# Patient Record
Sex: Female | Born: 1940 | Race: White | Hispanic: No | Smoking: Former smoker
Health system: Southern US, Community
[De-identification: ages and names within clinical notes are randomized; demographics above are authoritative.]

## PROBLEM LIST (undated history)

## (undated) DIAGNOSIS — G250 Essential tremor: Secondary | ICD-10-CM

## (undated) DIAGNOSIS — K219 Gastro-esophageal reflux disease without esophagitis: Secondary | ICD-10-CM

## (undated) DIAGNOSIS — M1990 Unspecified osteoarthritis, unspecified site: Secondary | ICD-10-CM

## (undated) DIAGNOSIS — F329 Major depressive disorder, single episode, unspecified: Secondary | ICD-10-CM

## (undated) DIAGNOSIS — F419 Anxiety disorder, unspecified: Secondary | ICD-10-CM

## (undated) DIAGNOSIS — M7552 Bursitis of left shoulder: Secondary | ICD-10-CM

## (undated) DIAGNOSIS — I10 Essential (primary) hypertension: Secondary | ICD-10-CM

## (undated) DIAGNOSIS — E7800 Pure hypercholesterolemia, unspecified: Secondary | ICD-10-CM

## (undated) DIAGNOSIS — M545 Low back pain, unspecified: Secondary | ICD-10-CM

## (undated) DIAGNOSIS — F32A Depression, unspecified: Secondary | ICD-10-CM

## (undated) DIAGNOSIS — E039 Hypothyroidism, unspecified: Secondary | ICD-10-CM

## (undated) DIAGNOSIS — I519 Heart disease, unspecified: Secondary | ICD-10-CM

## (undated) HISTORY — DX: Essential tremor: G25.0

## (undated) HISTORY — DX: Essential (primary) hypertension: I10

## (undated) HISTORY — DX: Low back pain, unspecified: M54.50

## (undated) HISTORY — PX: TONSILLECTOMY: SUR1361

## (undated) HISTORY — DX: Depression, unspecified: F32.A

## (undated) HISTORY — PX: TUBAL LIGATION: SHX77

## (undated) HISTORY — DX: Gastro-esophageal reflux disease without esophagitis: K21.9

## (undated) HISTORY — DX: Low back pain: M54.5

## (undated) HISTORY — DX: Bursitis of left shoulder: M75.52

## (undated) HISTORY — PX: APPENDECTOMY: SHX54

## (undated) HISTORY — DX: Heart disease, unspecified: I51.9

## (undated) HISTORY — DX: Major depressive disorder, single episode, unspecified: F32.9

## (undated) HISTORY — DX: Pure hypercholesterolemia, unspecified: E78.00

## (undated) HISTORY — DX: Unspecified osteoarthritis, unspecified site: M19.90

## (undated) HISTORY — DX: Anxiety disorder, unspecified: F41.9

---

## 2017-08-10 ENCOUNTER — Ambulatory Visit: Payer: Self-pay | Admitting: Neurology

## 2017-09-07 ENCOUNTER — Ambulatory Visit: Payer: Self-pay | Admitting: Neurology

## 2017-09-08 ENCOUNTER — Ambulatory Visit: Payer: Self-pay | Admitting: Neurology

## 2017-09-28 ENCOUNTER — Encounter: Payer: Self-pay | Admitting: Neurology

## 2017-09-28 ENCOUNTER — Telehealth: Payer: Self-pay | Admitting: Neurology

## 2017-09-28 ENCOUNTER — Ambulatory Visit: Payer: Medicare HMO | Admitting: Neurology

## 2017-09-28 VITALS — BP 129/73 | HR 86 | Ht 63.0 in | Wt 162.0 lb

## 2017-09-28 DIAGNOSIS — R251 Tremor, unspecified: Secondary | ICD-10-CM

## 2017-09-28 NOTE — Telephone Encounter (Signed)
Mcarthur Rossetti Josem Kaufmann: [phone] (exp. 09/28/17 to 10/28/17) order faxed to [facility] they will reach out to the pt to schedule

## 2017-09-28 NOTE — Patient Instructions (Addendum)
Your exam neurological exam looks good, no telltale tremor today. No signs of parkinsonism or Parkinson's disease, which is reassuring.   Please remember, that any kind of tremor may be exacerbated by anxiety, anger, nervousness, excitement, dehydration, sleep deprivation, by caffeine, and low blood sugar values or blood sugar fluctuations. Some medications, especially some antidepressants and lithium can cause or exacerbate tremors.  We will do a brain scan, called MRI and call you with the test results. We will have to schedule you for this on a separate date. This test requires authorization from your insurance, and we will take care of the insurance process.  So long as your brain MRI is age-appropriate, I could see you back on an as-needed basis.

## 2017-09-28 NOTE — Progress Notes (Signed)
Subjective:    Patient ID: Katie Wagner is a 77 y.o. female.  HPI     Star Age, MD, PhD [facility] [address]O. [place], [place] [address]  Dear Jeannetta Nap,   I saw your patient, Katie Wagner, upon your kind request in my neurologic clinic today for initial consultation of her tremor. The patient is unaccompanied today. As you know, Katie Wagner is a 77 year old right-handed woman with an underlying medical history of allergies, hypertension, hyperlipidemia, reflux disease, depression and mildly overweight state, who reports an approximately one-year history of intermittent tremor, affecting primarily her left upper extremity. She noticed it primarily in the later part of the day. It does not always bother her. It is not necessarily a daily occurrence. She has not noticed any telltale trigger. She does not report a family history of tremors with the exception of one sister who had a hand tremor until she was treated for her thyroid problem. Patient reports a recent abnormal thyroid function test for which she has a follow-up appointment. I reviewed your office note from 06/27/2017, which you kindly included. She reports a family history of Parkinson's disease in her paternal grandmother and 2 paternal cousins were not siblings. She is the oldest of the girls, she has a total of 2 brothers and 4 sisters, she is the third sibling. She lost one sister last year. She is single/divorced and lives alone. She is retired Education officer, environmental), has 3 grown children. She quit smoking in 1980, drinks alcohol very infrequently, drinks caffeine in the form of coffee, 1-2 cups in the mornings, 1 diet cola per day and 1-2 cups of iced tea. She admits that she does not always drink enough water. She has not noticed any balance problems with fine motor difficulties, no falls, no memory loss. She has a long-standing history of anxiety and is well treated on generic Effexor. She has  been on it for about 16 years or so.  Her Past Medical History Is Significant For: Past Medical History:  Diagnosis Date  . Anxiety   . Arthritis   . Bursitis of left shoulder   . Depression   . Essential tremor   . GERD (gastroesophageal reflux disease)   . Heart disease   . Hypercholesteremia   . Hypertension   . Low back pain     Her Past Surgical History Is Significant For:  Her Family History Is Significant For: Family History  Problem Relation Age of Onset  . Stroke Mother   . Dementia Mother   . Lung cancer Father   . Heart disease Father   . Thyroid disease Sister   . Diabetes Brother   . Alzheimer's disease Brother     Her Social History Is Significant For: Social History   Socioeconomic History  . Marital status: Divorced    Spouse name: Not on file  . Number of children: Not on file  . Years of education: Not on file  . Highest education level: Not on file  Occupational History  . Not on file  Social Needs  . Financial resource strain: Not on file  . Food insecurity:    Worry: Not on file    Inability: Not on file  . Transportation needs:    Medical: Not on file    Non-medical: Not on file  Tobacco Use  . Smoking status: Former Research scientist (life sciences)  . Smokeless tobacco: Never Used  Substance and Sexual Activity  . Alcohol use: Yes  .  Drug use: Never  . Sexual activity: Not on file  Lifestyle  . Physical activity:    Days per week: Not on file    Minutes per session: Not on file  . Stress: Not on file  Relationships  . Social connections:    Talks on phone: Not on file    Gets together: Not on file    Attends religious service: Not on file    Active member of club or organization: Not on file    Attends meetings of clubs or organizations: Not on file    Relationship status: Not on file  Other Topics Concern  . Not on file  Social History Narrative  . Not on file    Her Allergies Are:  No Known Allergies:   Her Current Medications Are:   Outpatient Encounter Medications as of 09/28/2017  Medication Sig  . acetaminophen (TYLENOL) 500 MG tablet Take 500 mg by mouth.  . cetirizine (ZYRTEC) 10 MG tablet Take 10 mg by mouth daily.  . fluticasone (FLONASE) 50 MCG/ACT nasal spray Place into both nostrils daily.  Marland Kitchen lisinopril-hydrochlorothiazide (PRINZIDE,ZESTORETIC) 20-12.5 MG tablet Take 1 tablet by mouth daily.  Marland Kitchen omeprazole (PRILOSEC) 20 MG capsule Take 20 mg by mouth daily.  . pravastatin (PRAVACHOL) 20 MG tablet Take 20 mg by mouth daily.  Marland Kitchen venlafaxine XR (EFFEXOR-XR) 150 MG 24 hr capsule Take 150 mg by mouth daily with breakfast.  . [DISCONTINUED] calcium-vitamin D (OSCAL WITH D) 500-200 MG-UNIT tablet Take 1 tablet by mouth.   No facility-administered encounter medications on file as of 09/28/2017.   : Review of Systems:  Out of a complete 14 point review of systems, all are reviewed and negative with the exception of these symptoms as listed below:  Review of Systems  Neurological:       Pt presents today to discuss her tremor. Pt notices the tremors in her left hand and sometimes in her face. Pt has noticed these tremors for about one year. Pt is right handed.    Objective:  Neurological Exam  Physical Exam Physical Examination:   Vitals:   09/28/17 1431  BP: 129/73  Pulse: 86    General Examination: The patient is a very pleasant 77 y.o. female in no acute distress. She appears well-developed and well-nourished and well groomed.   HEENT: Normocephalic, atraumatic, pupils are equal, round and reactive to light and accommodation. Extraocular tracking is good without limitation to gaze excursion or nystagmus noted. Normal smooth pursuit is noted. Hearing is grossly intact. Face is symmetric with normal facial animation and normal facial sensation. Speech is clear with no dysarthria noted. There is no hypophonia. There is no lip, neck/head, jaw or voice tremor. Neck is supple with full range of passive and active  motion. There are no carotid bruits on auscultation. Oropharynx exam reveals; nonfocal findings, she has no orofacial dyskinesias.   Chest: Clear to auscultation without wheezing, rhonchi or crackles noted.  Heart: S1+S2+0, regular and normal without murmurs, rubs or gallops noted.   Abdomen: Soft, non-tender and non-distended with normal bowel sounds appreciated on auscultation.  Extremities: There is no pitting edema in the distal lower extremities bilaterally.  Skin: Warm and dry without trophic changes noted.  Musculoskeletal: exam reveals no obvious joint deformities, tenderness or joint swelling or erythema. Left shoulder slightly higher than right.  Neurologically:  Mental status: The patient is awake, alert and oriented in all 4 spheres. Her immediate and remote memory, attention, language skills and fund of  knowledge are appropriate. There is no evidence of aphasia, agnosia, apraxia or anomia. Speech is clear with normal prosody and enunciation. Thought process is linear. Mood is normal and affect is normal.  Cranial nerves II - XII are as described above under HEENT exam.   Motor exam: Normal bulk, strength and tone is noted. There is no drift, or rebound. No resting tremor. She has no apneas posture or action tremor with both upper extremities, no lower extremity tremor.   On 09/28/2017: On Archimedes spiral drawing she has no difficulty with the right which is her dominant hand, minimal tremulousness noted with the left. Handwriting with the right is legible, minimally tremulous, not particularly micrographic.  Romberg is negative. Reflexes are 2+ throughout. Fine motor skills and coordination: intact with normal finger taps, normal hand movements, normal rapid alternating patting, normal foot taps and normal foot agility.  Cerebellar testing: No dysmetria or intention tremor on finger to nose testing. Heel to shin is unremarkable bilaterally. There is no truncal or gait ataxia.   Sensory exam: intact to light touch in the upper and lower extremities.  Gait, station and balance: She stands easily. No veering to one side is noted. No leaning to one side is noted. Posture is age-appropriate and stance is narrow based. Gait shows normal stride length and normal pace. No problems turning are noted. Questionable decreased arm swing on the left.  Assessment and Plan:    In summary, Katie Wagner is a very pleasant 77 y.o.-year old female with an underlying medical history of allergies, hypertension, hyperlipidemia, reflux disease, depression and mildly overweight state, who presents for evaluation of her intermittent tremors affecting her left hand. On examination she has a benign exam, no telltale signs of parkinsonism, no telltale signs of essential tremor. She is reassured in that regard. I would like to proceed with a brain MRI for diagnostic completion, to rule out a structural cause or stroke for her symptoms. We talked about tremor triggers including anxiety, certain antidepressant medications, caffeine, sleep deprivation, nervousness, dehydration, blood sugar fluctuations, thyroid dysfunction etc. at length today. She is advised to follow-up with you for her thyroid function. Furthermore, she is encouraged to stay better hydrated with water, try to decrease her caffeine intake. Her anxiety is under good control with the current dose of Effexor generic, no reason to change the dose of the medication from my end of things. I suggested watchful waiting and observation clinically. She can follow-up with me as needed, in the interim, we will call her with her MRI results when they are available. I answered all her questions today and she was in agreement.  Thank you very much for allowing me to participate in the care of this nice patient. If I can be of any further assistance to you please do not hesitate to call me at [phone].  Sincerely,   Star Age, MD, PhD

## 2017-09-29 NOTE — Telephone Encounter (Signed)
Patient is scheduled at [facility] for 10/26/17.

## 2017-10-31 ENCOUNTER — Telehealth: Payer: Self-pay | Admitting: Neurology

## 2017-10-31 DIAGNOSIS — D329 Benign neoplasm of meninges, unspecified: Secondary | ICD-10-CM

## 2017-10-31 NOTE — Telephone Encounter (Signed)
Please call patient regarding the recent brain MRI: The brain scan showed a normal structure of the brain and mild volume loss which we call atrophy. There were changes in the deeper structures of the brain, which we call white matter changes or microvascular changes. These were reported as mild in Her case. These are tiny white spots, that occur with time and are seen in a variety of conditions, including with normal aging, chronic hypertension, chronic headaches, especially migraine HAs, chronic diabetes, chronic hyperlipidemia.  While there were no acute findings and no explanation for her tremor, there was one abnormality noted which was a couple of areas where the contrast was picked up more intensely, this was reported as an incidental finding and in keeping with benign tumors called meningiomas (which don't affect the brain and arise from the outer lining of the brain). Again, this is a incidental finding and does not explain her symptoms. We can consider follow-up MRI for this in about 3 to 6 months. No treatment is typically necessary, but sometimes we seek a surgical consultation.   Again, there were no acute findings, such as a stroke, or mass or blood products. No further action is required on this test at this time, other than re-enforcing the importance of good blood pressure control, good cholesterol control, good blood sugar control, and weight management.   I would be happy to order another MRI in about 3 months for a recheck and see her back as needed.  She can call in 3 months to request the FU MRI.

## 2017-10-31 NOTE — Telephone Encounter (Signed)
Patient calling to get MRI results.

## 2017-11-01 NOTE — Telephone Encounter (Signed)
I called pt to discuss her MRI results. No answer, left a message asking her to call me back. 

## 2017-11-01 NOTE — Telephone Encounter (Signed)
I called pt and discussed her MRI results with her. Pt is agreeable to rechecking her meningiomas in 3 months and will call us to reorder the MRI. I reiterated the importance of good BP, cholesterol, BS control, and weight management. Pt verbalized understanding of results. Pt asked that I send a copy of these results to [place], [place]. Pt had no questions at this time but was encouraged to call back if questions arise.

## 2018-01-30 NOTE — Addendum Note (Signed)
Addended by: Star Age on: 01/30/2018 05:00 PM   Modules accepted: Orders

## 2018-01-30 NOTE — Telephone Encounter (Signed)
Patient called stating she needs to redo her MRI Brain please advise.

## 2018-01-30 NOTE — Telephone Encounter (Signed)
Ordered MRI brain w/wo contrast to re-eval meningiomas.

## 2018-01-31 ENCOUNTER — Telehealth: Payer: Self-pay | Admitting: Neurology

## 2018-01-31 NOTE — Telephone Encounter (Signed)
Patient is aware 

## 2018-01-31 NOTE — Telephone Encounter (Signed)
Mcarthur Rossetti Josem Kaufmann: [phone] (exp. 01/31/18 to 03/02/18) order faxed to [facility] for open MRI they will reach out to the pt to schedule

## 2018-02-08 ENCOUNTER — Telehealth: Payer: Self-pay

## 2018-02-08 NOTE — Telephone Encounter (Signed)
MRI brain shows stable findings and no FU is recommended as far as the reading radiologist's report. No further action is required.  I can see her back for tremor as needed at this point.  Please update pt.

## 2018-02-08 NOTE — Telephone Encounter (Signed)
Received MRI results from [facility]. 

## 2018-02-08 NOTE — Telephone Encounter (Addendum)
I called Katie Wagner and discussed her MRI results. Katie Wagner will follow up with Dr. Rexene Alberts PRN for her tremor. Katie Wagner verbalized understanding of results. Katie Wagner had no questions at this time but was encouraged to call back if questions arise.

## 2018-02-17 ENCOUNTER — Telehealth: Payer: Self-pay

## 2018-02-17 NOTE — Telephone Encounter (Signed)
SENT REFERRAL TO SCHEDULING AND FILED NOTES 

## 2018-04-03 ENCOUNTER — Ambulatory Visit: Payer: Medicare HMO | Admitting: Cardiovascular Disease

## 2018-04-03 ENCOUNTER — Encounter: Payer: Self-pay | Admitting: *Deleted

## 2018-04-03 ENCOUNTER — Encounter: Payer: Self-pay | Admitting: Cardiovascular Disease

## 2018-04-03 VITALS — BP 132/70 | HR 97 | Ht 63.0 in | Wt 165.0 lb

## 2018-04-03 DIAGNOSIS — I447 Left bundle-branch block, unspecified: Secondary | ICD-10-CM

## 2018-04-03 DIAGNOSIS — I10 Essential (primary) hypertension: Secondary | ICD-10-CM

## 2018-04-03 DIAGNOSIS — R0609 Other forms of dyspnea: Secondary | ICD-10-CM | POA: Diagnosis not present

## 2018-04-03 NOTE — Progress Notes (Signed)
CARDIOLOGY CONSULT NOTE  Patient ID: Katie Wagner MRN: [id number] DOB/AGE: 1941-02-25 78 y.o.  Admit date: (Not on file) Primary Physician: Denny Levy, [place] Referring Physician: Denny Levy, [place]  Reason for Consultation: Chest pain  HPI: Katie Wagner is a 78 y.o. female who is being seen today for the evaluation of chest pain at the request of Denny Levy, [place].   Past medical history includes hypertension and hyperlipidemia.  I reviewed notes from her PCP.  I also personally reviewed an ECG performed on 02/14/2018 which demonstrated sinus rhythm and left bundle branch block.  I reviewed labs: TSH was normal at 1.69 on 09/23/2017.  A1c was 6%.  She has a long history of left bundle branch block.  She brought in a nuclear stress test report which I reviewed dated 06/25/1999 which showed no evidence of ischemia but did demonstrate hypertensive heart disease.  She had been having chest tightness but thinks it was due to an upper respiratory tract infection.  The symptoms have since resolved.  She does have some exertional dyspnea when doing routine household activities.  She denies palpitations, leg swelling, orthopnea, syncope, and paroxysmal nocturnal dyspnea.  Her sister, Alvina Filbert, used to be my patient.  She passed away in May 31, 2016.  No Known Allergies  Current Outpatient Medications  Medication Sig Dispense Refill  . acetaminophen (TYLENOL) 500 MG tablet Take 500 mg by mouth.    . cetirizine (ZYRTEC) 10 MG tablet Take 10 mg by mouth daily.    . fluticasone (FLONASE) 50 MCG/ACT nasal spray Place into both nostrils daily.    Marland Kitchen lisinopril-hydrochlorothiazide (PRINZIDE,ZESTORETIC) 20-12.5 MG tablet Take 1 tablet by mouth daily.    Marland Kitchen omeprazole (PRILOSEC) 20 MG capsule Take 20 mg by mouth daily.    . pravastatin (PRAVACHOL) 20 MG tablet Take 20 mg by mouth daily.    Marland Kitchen venlafaxine XR (EFFEXOR-XR) 150 MG 24 hr capsule Take 150 mg by mouth daily with  breakfast.     No current facility-administered medications for this visit.     Past Medical History:  Diagnosis Date  . Anxiety   . Arthritis   . Bursitis of left shoulder   . Depression   . Essential tremor   . GERD (gastroesophageal reflux disease)   . Heart disease   . Hypercholesteremia   . Hypertension   . Low back pain     Past Surgical History:  Procedure Laterality Date  . APPENDECTOMY    . TONSILLECTOMY    . TUBAL LIGATION      Social History   Socioeconomic History  . Marital status: Divorced    Spouse name: Not on file  . Number of children: Not on file  . Years of education: Not on file  . Highest education level: Not on file  Occupational History  . Not on file  Social Needs  . Financial resource strain: Not on file  . Food insecurity:    Worry: Not on file    Inability: Not on file  . Transportation needs:    Medical: Not on file    Non-medical: Not on file  Tobacco Use  . Smoking status: Former Research scientist (life sciences)  . Smokeless tobacco: Never Used  Substance and Sexual Activity  . Alcohol use: Yes  . Drug use: Never  . Sexual activity: Not on file  Lifestyle  . Physical activity:    Days per week: Not on file    Minutes per session: Not on  file  . Stress: Not on file  Relationships  . Social connections:    Talks on phone: Not on file    Gets together: Not on file    Attends religious service: Not on file    Active member of club or organization: Not on file    Attends meetings of clubs or organizations: Not on file    Relationship status: Not on file  . Intimate partner violence:    Fear of current or ex partner: Not on file    Emotionally abused: Not on file    Physically abused: Not on file    Forced sexual activity: Not on file  Other Topics Concern  . Not on file  Social History Narrative  . Not on file     No family history of premature CAD in 1st degree relatives.  Current Meds  Medication Sig  . acetaminophen (TYLENOL) 500 MG  tablet Take 500 mg by mouth.  . cetirizine (ZYRTEC) 10 MG tablet Take 10 mg by mouth daily.  . fluticasone (FLONASE) 50 MCG/ACT nasal spray Place into both nostrils daily.  Marland Kitchen lisinopril-hydrochlorothiazide (PRINZIDE,ZESTORETIC) 20-12.5 MG tablet Take 1 tablet by mouth daily.  Marland Kitchen omeprazole (PRILOSEC) 20 MG capsule Take 20 mg by mouth daily.  . pravastatin (PRAVACHOL) 20 MG tablet Take 20 mg by mouth daily.  Marland Kitchen venlafaxine XR (EFFEXOR-XR) 150 MG 24 hr capsule Take 150 mg by mouth daily with breakfast.      Review of systems complete and found to be negative unless listed above in HPI    Physical exam Blood pressure 132/70, pulse 97, height 5\' 3"  (1.6 m), weight 165 lb (74.8 kg), SpO2 94 %. General: NAD Neck: No JVD, no thyromegaly or thyroid nodule.  Lungs: Clear to auscultation bilaterally with normal respiratory effort. CV: Nondisplaced PMI. Regular rate and rhythm, normal S1/S2, no S3/S4, no murmur.  No peripheral edema.  No carotid bruit.    Abdomen: Soft, nontender, no distention.  Skin: Intact without lesions or rashes.  Neurologic: Alert and oriented x 3.  Psych: Normal affect. Extremities: No clubbing or cyanosis.  HEENT: Normal.   ECG: Most recent ECG reviewed.   Labs: No results found for: K, BUN, CREATININE, ALT, TSH, HGB   Lipids: No results found for: LDLCALC, LDLDIRECT, CHOL, TRIG, HDL      ASSESSMENT AND PLAN:  1.  Shortness of breath/left bundle branch block: She underwent a nuclear stress test in 2001 which showed no evidence of ischemia. I will proceed with a nuclear myocardial perfusion imaging study to evaluate for ischemic heart disease (Lexiscan Myoview). I will order a 2-D echocardiogram with Doppler to evaluate cardiac structure, function, and regional wall motion.  2.  Hypertension: Blood pressure is normal.  No changes to therapy.    Disposition: Follow up in 4 months  Signed: Kate Sable, M.D., F.A.C.C.  04/03/2018, 2:36 PM

## 2018-04-03 NOTE — Patient Instructions (Signed)
Medication Instructions:  Continue all current medications.  Labwork: none  Testing/Procedures:  Your physician has requested that you have an echocardiogram. Echocardiography is a painless test that uses sound waves to create images of your heart. It provides your doctor with information about the size and shape of your heart and how well your heart's chambers and valves are working. This procedure takes approximately one hour. There are no restrictions for this procedure.  Your physician has requested that you have a lexiscan myoview. For further information please visit [email or web address]. Please follow instruction sheet, as given.  Office will contact with results via phone or letter.    Follow-Up: 4 months   Any Other Special Instructions Will Be Listed Below (If Applicable).  If you need a refill on your cardiac medications before your next appointment, please call your pharmacy.

## 2018-04-04 ENCOUNTER — Telehealth: Payer: Self-pay | Admitting: Cardiovascular Disease

## 2018-04-04 NOTE — Telephone Encounter (Signed)
Pre-cert Verification for the following procedure    Echo scheduled 04-13-2018 at [facility] scheduled for 04-14-2018 at [facility]

## 2018-04-13 ENCOUNTER — Ambulatory Visit (INDEPENDENT_AMBULATORY_CARE_PROVIDER_SITE_OTHER): Payer: Medicare HMO

## 2018-04-13 DIAGNOSIS — I447 Left bundle-branch block, unspecified: Secondary | ICD-10-CM | POA: Diagnosis not present

## 2018-04-13 DIAGNOSIS — R0609 Other forms of dyspnea: Secondary | ICD-10-CM | POA: Diagnosis not present

## 2018-04-14 ENCOUNTER — Encounter (HOSPITAL_BASED_OUTPATIENT_CLINIC_OR_DEPARTMENT_OTHER)
Admission: RE | Admit: 2018-04-14 | Discharge: 2018-04-14 | Disposition: A | Discharge status: Home / Self Care | Payer: Medicare HMO | Source: Ambulatory Visit | Attending: Cardiovascular Disease | Admitting: Cardiovascular Disease

## 2018-04-14 ENCOUNTER — Encounter (HOSPITAL_COMMUNITY)
Admission: RE | Admit: 2018-04-14 | Discharge: 2018-04-14 | Disposition: A | Discharge status: Home / Self Care | Payer: Medicare HMO | Source: Ambulatory Visit | Attending: Cardiovascular Disease | Admitting: Cardiovascular Disease

## 2018-04-14 ENCOUNTER — Telehealth: Payer: Self-pay | Admitting: *Deleted

## 2018-04-14 DIAGNOSIS — I447 Left bundle-branch block, unspecified: Secondary | ICD-10-CM | POA: Insufficient documentation

## 2018-04-14 DIAGNOSIS — R0609 Other forms of dyspnea: Secondary | ICD-10-CM

## 2018-04-14 LAB — NM MYOCAR MULTI W/SPECT W/WALL MOTION / EF
LV dias vol: 65 mL (ref 46–106)
LV sys vol: 20 mL
Peak HR: 81 {beats}/min
RATE: 0.48
Rest HR: 66 {beats}/min
SDS: 1
SRS: 4
SSS: 5
TID: 1.03

## 2018-04-14 MED ORDER — TECHNETIUM TC 99M TETROFOSMIN IV KIT
30.0000 | PACK | Freq: Once | INTRAVENOUS | Status: AC | PRN
  Administered 2018-04-14: 31 via INTRAVENOUS

## 2018-04-14 MED ORDER — REGADENOSON 0.4 MG/5ML IV SOLN
INTRAVENOUS | Status: AC
  Administered 2018-04-14: 0.4 mg via INTRAVENOUS
  Filled 2018-04-14: qty 5

## 2018-04-14 MED ORDER — SODIUM CHLORIDE 0.9% FLUSH
INTRAVENOUS | Status: AC
  Administered 2018-04-14: 10 mL via INTRAVENOUS
  Filled 2018-04-14: qty 10

## 2018-04-14 MED ORDER — TECHNETIUM TC 99M TETROFOSMIN IV KIT
10.0000 | PACK | Freq: Once | INTRAVENOUS | Status: AC | PRN
  Administered 2018-04-14: 10 via INTRAVENOUS

## 2018-04-14 NOTE — Telephone Encounter (Signed)
STRESS TEST -  Notes recorded by Herminio Commons, MD on 04/14/2018 at 2:44 PM EST No blockages.   ECHO -  Notes recorded by Herminio Commons, MD on 04/13/2018 at 4:34 PM EST Normal pumping function.

## 2018-04-14 NOTE — Telephone Encounter (Signed)
Notes recorded by Laurine Blazer, LPN on 08/06/8183 at 9:09 PM EST Patient notified. Copy to pmd. Follow up scheduled for May with Dr. Paralee Cancel office.

## 2018-07-25 DIAGNOSIS — Z6828 Body mass index (BMI) 28.0-28.9, adult: Secondary | ICD-10-CM | POA: Diagnosis not present

## 2018-07-25 DIAGNOSIS — L247 Irritant contact dermatitis due to plants, except food: Secondary | ICD-10-CM | POA: Diagnosis not present

## 2018-08-04 ENCOUNTER — Telehealth: Payer: Self-pay | Admitting: Cardiovascular Disease

## 2018-08-04 NOTE — Telephone Encounter (Signed)
Virtual Visit Pre-Appointment Phone Call  "(Name), I am calling you today to discuss your upcoming appointment. We are currently trying to limit exposure to the virus that causes COVID-19 by seeing patients at home rather than in the office."  1. "What is the BEST phone number to call the day of the visit?" - include this in appointment notes  2. Do you have or have access to (through a family member/friend) a smartphone with video capability that we can use for your visit?" a. If yes - list this number in appt notes as cell (if different from BEST phone #) and list the appointment type as a VIDEO visit in appointment notes b. If no - list the appointment type as a PHONE visit in appointment notes  3. Confirm consent - "In the setting of the current Covid19 crisis, you are scheduled for a (phone or video) visit with your provider on (date) at (time).  Just as we do with many in-office visits, in order for you to participate in this visit, we must obtain consent.  If you'd like, I can send this to your mychart (if signed up) or email for you to review.  Otherwise, I can obtain your verbal consent now.  All virtual visits are billed to your insurance company just like a normal visit would be.  By agreeing to a virtual visit, we'd like you to understand that the technology does not allow for your provider to perform an examination, and thus may limit your provider's ability to fully assess your condition. If your provider identifies any concerns that need to be evaluated in person, we will make arrangements to do so.  Finally, though the technology is pretty good, we cannot assure that it will always work on either your or our end, and in the setting of a video visit, we may have to convert it to a phone-only visit.  In either situation, we cannot ensure that we have a secure connection.  Are you willing to proceed?" STAFF: Did the patient verbally acknowledge consent to telehealth visit? Document  YES/NO here: yes  4. Advise patient to be prepared - "Two hours prior to your appointment, go ahead and check your blood pressure, pulse, oxygen saturation, and your weight (if you have the equipment to check those) and write them all down. When your visit starts, your provider will ask you for this information. If you have an Apple Watch or Kardia device, please plan to have heart rate information ready on the day of your appointment. Please have a pen and paper handy nearby the day of the visit as well."  5. Give patient instructions for MyChart download to smartphone OR Doximity/Doxy.me as below if video visit (depending on what platform provider is using)  6. Inform patient they will receive a phone call 15 minutes prior to their appointment time (may be from unknown caller ID) so they should be prepared to answer    TELEPHONE CALL NOTE  Katie Wagner has been deemed a candidate for a follow-up tele-health visit to limit community exposure during the Covid-19 pandemic. I spoke with the patient via phone to ensure availability of phone/video source, confirm preferred email & phone number, and discuss instructions and expectations.  I reminded Katie Wagner to be prepared with any vital sign and/or heart rhythm information that could potentially be obtained via home monitoring, at the time of her visit. I reminded Katie Wagner to expect a phone call prior to  her visit.  Katie Wagner 08/04/2018 2:49 PM   INSTRUCTIONS FOR DOWNLOADING THE MYCHART APP TO SMARTPHONE  - The patient must first make sure to have activated MyChart and know their login information - If Apple, go to [employer] and type in MyChart in the search bar and download the app. If Android, ask patient to go to [employer] and type in [place] in the search bar and download the app. The app is free but as with any other app downloads, their phone may require them to verify saved payment information or  Apple/Android password.  - The patient will need to then log into the app with their MyChart username and password, and select [facility] as their healthcare provider to link the account. When it is time for your visit, go to the MyChart app, find appointments, and click Begin Video Visit. Be sure to Select Allow for your device to access the Microphone and Camera for your visit. You will then be connected, and your provider will be with you shortly.  **If they have any issues connecting, or need assistance please contact MyChart service desk [phone])**  **If using a computer, in order to ensure the best quality for their visit they will need to use either of the following Internet Browsers: [employer], or Google Chrome**  IF USING DOXIMITY or DOXY.ME - The patient will receive a link just prior to their visit by text.     FULL LENGTH CONSENT FOR TELE-HEALTH VISIT   I hereby voluntarily request, consent and authorize [facility] and its employed or contracted physicians, physician assistants, nurse practitioners or other licensed health care professionals (the Practitioner), to provide me with telemedicine health care services (the Services") as deemed necessary by the treating Practitioner. I acknowledge and consent to receive the Services by the Practitioner via telemedicine. I understand that the telemedicine visit will involve communicating with the Practitioner through live audiovisual communication technology and the disclosure of certain medical information by electronic transmission. I acknowledge that I have been given the opportunity to request an in-person assessment or other available alternative prior to the telemedicine visit and am voluntarily participating in the telemedicine visit.  I understand that I have the right to withhold or withdraw my consent to the use of telemedicine in the course of my care at any time, without affecting my right to future care  or treatment, and that the Practitioner or I may terminate the telemedicine visit at any time. I understand that I have the right to inspect all information obtained and/or recorded in the course of the telemedicine visit and may receive copies of available information for a reasonable fee.  I understand that some of the potential risks of receiving the Services via telemedicine include:   Delay or interruption in medical evaluation due to technological equipment failure or disruption;  Information transmitted may not be sufficient (e.g. poor resolution of images) to allow for appropriate medical decision making by the Practitioner; and/or   In rare instances, security protocols could fail, causing a breach of personal health information.  Furthermore, I acknowledge that it is my responsibility to provide information about my medical history, conditions and care that is complete and accurate to the best of my ability. I acknowledge that Practitioner's advice, recommendations, and/or decision may be based on factors not within their control, such as incomplete or inaccurate data provided by me or distortions of diagnostic images or specimens that may result from electronic transmissions. I  understand that the practice of medicine is not an exact science and that Practitioner makes no warranties or guarantees regarding treatment outcomes. I acknowledge that I will receive a copy of this consent concurrently upon execution via email to the email address I last provided but may also request a printed copy by calling the office of [facility].    I understand that my insurance will be billed for this visit.   I have read or had this consent read to me.  I understand the contents of this consent, which adequately explains the benefits and risks of the Services being provided via telemedicine.   I have been provided ample opportunity to ask questions regarding this consent and the Services and have had  my questions answered to my satisfaction.  I give my informed consent for the services to be provided through the use of telemedicine in my medical care  By participating in this telemedicine visit I agree to the above.

## 2018-08-09 ENCOUNTER — Other Ambulatory Visit: Payer: Self-pay

## 2018-08-09 ENCOUNTER — Encounter: Payer: Self-pay | Admitting: Cardiovascular Disease

## 2018-08-09 ENCOUNTER — Telehealth (INDEPENDENT_AMBULATORY_CARE_PROVIDER_SITE_OTHER): Payer: Medicare HMO | Admitting: Cardiovascular Disease

## 2018-08-09 VITALS — BP 142/74 | HR 73 | Ht 63.0 in | Wt 160.0 lb

## 2018-08-09 DIAGNOSIS — I10 Essential (primary) hypertension: Secondary | ICD-10-CM | POA: Diagnosis not present

## 2018-08-09 DIAGNOSIS — R0602 Shortness of breath: Secondary | ICD-10-CM | POA: Diagnosis not present

## 2018-08-09 DIAGNOSIS — I447 Left bundle-branch block, unspecified: Secondary | ICD-10-CM | POA: Diagnosis not present

## 2018-08-09 DIAGNOSIS — R0609 Other forms of dyspnea: Secondary | ICD-10-CM

## 2018-08-09 NOTE — Progress Notes (Signed)
Virtual Visit via Telephone Note   This visit type was conducted due to national recommendations for restrictions regarding the COVID-19 Pandemic (e.g. social distancing) in an effort to limit this patient's exposure and mitigate transmission in our community.  Due to her co-morbid illnesses, this patient is at least at moderate risk for complications without adequate follow up.  This format is felt to be most appropriate for this patient at this time.  The patient did not have access to video technology/had technical difficulties with video requiring transitioning to audio format only (telephone).  All issues noted in this document were discussed and addressed.  No physical exam could be performed with this format.  Please refer to the patient's chart for her  consent to telehealth for [facility].   Date:  08/09/2018   ID:  Katie Wagner, Katie Wagner [birth year], MRN [id number]  Patient Location: Home Provider Location: Office  PCP:  Denny Levy, [place]  Cardiologist:  No primary care provider on file.  Electrophysiologist:  None   Evaluation Performed:  Follow-Up Visit  Chief Complaint:  Shortness of breath  History of Present Illness:    Katie Wagner is a 78 y.o. female with hypertension, hyperlipidemia, chronic left bundle branch block, and shortness of breath.  She underwent a normal nuclear stress test on 04/14/2018.  She currently denies chest pain and shortness of breath. She's been trying to work in her yard.  The patient does not have symptoms concerning for COVID-19 infection (fever, chills, cough, or new shortness of breath).   Her sister, Alvina Filbert, used to be my patient.  She passed away in 2016-06-16.   Past Medical History:  Diagnosis Date  . Anxiety   . Arthritis   . Bursitis of left shoulder   . Depression   . Essential tremor   . GERD (gastroesophageal reflux disease)   . Heart disease   . Hypercholesteremia   . Hypertension   . Low back pain    Past  Surgical History:  Procedure Laterality Date  . APPENDECTOMY    . TONSILLECTOMY    . TUBAL LIGATION       Current Meds  Medication Sig  . acetaminophen (TYLENOL) 500 MG tablet Take 500 mg by mouth.  . cetirizine (ZYRTEC) 10 MG tablet Take 10 mg by mouth daily.  . fluticasone (FLONASE) 50 MCG/ACT nasal spray Place into both nostrils daily.  Marland Kitchen lisinopril-hydrochlorothiazide (PRINZIDE,ZESTORETIC) 20-12.5 MG tablet Take 1 tablet by mouth daily.  Marland Kitchen omeprazole (PRILOSEC) 20 MG capsule Take 20 mg by mouth daily.  . pravastatin (PRAVACHOL) 20 MG tablet Take 20 mg by mouth daily.  Marland Kitchen venlafaxine XR (EFFEXOR-XR) 150 MG 24 hr capsule Take 150 mg by mouth daily with breakfast.     Allergies:   Patient has no known allergies.   Social History   Tobacco Use  . Smoking status: Former Research scientist (life sciences)  . Smokeless tobacco: Never Used  Substance Use Topics  . Alcohol use: Yes  . Drug use: Never     Family Hx: The patient's family history includes Alzheimer's disease in her brother; Dementia in her mother; Diabetes in her brother; Heart disease in her father; Lung cancer in her father; Stroke in her mother; Thyroid disease in her sister.  ROS:   Please see the history of present illness.     All other systems reviewed and are negative.   Prior CV studies:   The following studies were reviewed today:  Normal nuclear stress test on 04/14/2018,  EF 70%.  Left bundle branch block seen throughout study.   Echocardiogram 04/13/2018:   1. The left ventricle has normal systolic function of 79-39%. The cavity size is normal. There is normal left ventricular wall thickness. Septal motion consistent with LBBB. Echo evidence of impaired relaxation diastolic filling patterns.  2. Mildly dilated left atrial size.  3. Normal right atrial size.  4. Normal tricuspid valve. Trivial tricuspid regurgitation.  5. The aortic root is normal is size and structure.  6. No atrial level shunt detected by color flow  Doppler.  Labs/Other Tests and Data Reviewed:    EKG:  No ECG reviewed.  Recent Labs: No results found for requested labs within last 8760 hours.   Recent Lipid Panel No results found for: CHOL, TRIG, HDL, CHOLHDL, LDLCALC, LDLDIRECT  Wt Readings from Last 3 Encounters:  08/09/18 160 lb (72.6 kg)  04/03/18 165 lb (74.8 kg)  09/28/17 162 lb (73.5 kg)     Objective:    Vital Signs:  BP (!) 142/74   Pulse 73   Ht 5\' 3"  (1.6 m)   Wt 160 lb (72.6 kg)   BMI 28.34 kg/m    VITAL SIGNS:  reviewed  ASSESSMENT & PLAN:    1.  Shortness of breath/left bundle branch block: Nuclear stress test was normal.  Left ventricular systolic function is also normal.  No further cardiac testing is indicated at this time.  2.  Hypertension: Blood pressure is mildly elevated today but was normal at last visit.  No changes to therapy.  3. LBBB: Chronic. Denies lightheadedness and dizziness.   COVID-19 Education: The signs and symptoms of COVID-19 were discussed with the patient and how to seek care for testing (follow up with PCP or arrange E-visit).  The importance of social distancing was discussed today.  Time:   Today, I have spent 15 minutes with the patient with telehealth technology discussing the above problems.     Medication Adjustments/Labs and Tests Ordered: Current medicines are reviewed at length with the patient today.  Concerns regarding medicines are outlined above.   Tests Ordered: No orders of the defined types were placed in this encounter.   Medication Changes: No orders of the defined types were placed in this encounter.   Disposition:  Follow up prn  Signed, Kate Sable, MD  08/09/2018 10:57 AM    [place]

## 2018-08-09 NOTE — Patient Instructions (Signed)
Medication Instructions:  Continue all current medications.  Labwork: none  Testing/Procedures: none  Follow-Up: As needed.    Any Other Special Instructions Will Be Listed Below (If Applicable).  If you need a refill on your cardiac medications before your next appointment, please call your pharmacy.  

## 2018-09-22 DIAGNOSIS — I10 Essential (primary) hypertension: Secondary | ICD-10-CM | POA: Diagnosis not present

## 2018-09-22 DIAGNOSIS — R739 Hyperglycemia, unspecified: Secondary | ICD-10-CM | POA: Diagnosis not present

## 2018-09-22 DIAGNOSIS — E782 Mixed hyperlipidemia: Secondary | ICD-10-CM | POA: Diagnosis not present

## 2018-09-26 DIAGNOSIS — G250 Essential tremor: Secondary | ICD-10-CM | POA: Diagnosis not present

## 2018-09-26 DIAGNOSIS — E782 Mixed hyperlipidemia: Secondary | ICD-10-CM | POA: Diagnosis not present

## 2018-09-26 DIAGNOSIS — I10 Essential (primary) hypertension: Secondary | ICD-10-CM | POA: Diagnosis not present

## 2018-09-26 DIAGNOSIS — J309 Allergic rhinitis, unspecified: Secondary | ICD-10-CM | POA: Diagnosis not present

## 2018-09-26 DIAGNOSIS — Z6829 Body mass index (BMI) 29.0-29.9, adult: Secondary | ICD-10-CM | POA: Diagnosis not present

## 2018-10-03 ENCOUNTER — Encounter (INDEPENDENT_AMBULATORY_CARE_PROVIDER_SITE_OTHER): Payer: Self-pay | Admitting: Internal Medicine

## 2018-10-03 ENCOUNTER — Other Ambulatory Visit: Payer: Self-pay

## 2018-10-03 ENCOUNTER — Ambulatory Visit (INDEPENDENT_AMBULATORY_CARE_PROVIDER_SITE_OTHER): Payer: Medicare HMO | Admitting: Internal Medicine

## 2018-10-03 ENCOUNTER — Encounter (INDEPENDENT_AMBULATORY_CARE_PROVIDER_SITE_OTHER): Payer: Self-pay | Admitting: *Deleted

## 2018-10-03 VITALS — BP 145/73 | HR 76 | Temp 98.3°F | Ht 63.0 in | Wt 164.5 lb

## 2018-10-03 DIAGNOSIS — R1310 Dysphagia, unspecified: Secondary | ICD-10-CM | POA: Diagnosis not present

## 2018-10-03 DIAGNOSIS — R1319 Other dysphagia: Secondary | ICD-10-CM

## 2018-10-03 DIAGNOSIS — K219 Gastro-esophageal reflux disease without esophagitis: Secondary | ICD-10-CM | POA: Diagnosis not present

## 2018-10-03 NOTE — Patient Instructions (Signed)
DG esophagus.  

## 2018-10-03 NOTE — Progress Notes (Addendum)
   Subjective:    Patient ID: Katie Wagner, female    DOB: [birth year], 78 y.o.   MRN: [id number]  HPI Referred by Denny Levy for abdominal pain,acid reflux/upper endoscopy.  She tells me several years ago, she choked. She says here esophagus was stretched by Dr. Britta Mccreedy.  She says she has a lot of mucous in her esophagus/throat.  She is not having any dysphagia. Sometimes foods are slow to go down. GERD controlled with Omeprazole. Appetite is good. No weight loss. BM move okay.    05/03/2012 EGD/ED Dr. Britta Mccreedy, dysphagia (I received this report 10/11/2018). Hiatal hernia. Schatzki's ring dilated with an 18 mm savory dilator.   Past Medical History:  Diagnosis Date  . Anxiety   . Arthritis   . Bursitis of left shoulder   . Depression   . Essential tremor   . GERD (gastroesophageal reflux disease)   . Heart disease   . Hypercholesteremia   . Hypertension   . Low back pain     Past Surgical History:  Procedure Laterality Date  . APPENDECTOMY    . TONSILLECTOMY    . TUBAL LIGATION      No Known Allergies  Current Outpatient Medications on File Prior to Visit  Medication Sig Dispense Refill  . acetaminophen (TYLENOL) 500 MG tablet Take 500 mg by mouth.    . Calcium-Magnesium-Vitamin D (CALCIUM 1200+D3 PO) Take 600 mg by mouth every morning.    . cetirizine (ZYRTEC) 10 MG tablet Take 10 mg by mouth daily.    . fluticasone (FLONASE) 50 MCG/ACT nasal spray Place into both nostrils daily.    Marland Kitchen lisinopril-hydrochlorothiazide (PRINZIDE,ZESTORETIC) 20-12.5 MG tablet Take 1 tablet by mouth daily.    Marland Kitchen omeprazole (PRILOSEC) 20 MG capsule Take 20 mg by mouth daily.    . pravastatin (PRAVACHOL) 20 MG tablet Take 20 mg by mouth daily.    . propranolol (INDERAL) 10 MG tablet Take 10 mg by mouth 3 (three) times daily.    Marland Kitchen venlafaxine XR (EFFEXOR-XR) 150 MG 24 hr capsule Take 150 mg by mouth daily with breakfast.     No current facility-administered medications on file prior to visit.            Objective:   Physical Exam Blood pressure (!) 145/73, pulse 76, temperature 98.3 F (36.8 C), height 5\' 3"  (1.6 m), weight 164 lb 8 oz (74.6 kg). Alert and oriented. Skin warm and dry. Oral mucosa is moist.   . Sclera anicteric, conjunctivae is pink. Thyroid not enlarged. No cervical lymphadenopathy. Lungs clear. Heart regular rate and rhythm.  Abdomen is soft. Bowel sounds are positive. No hepatomegaly. No abdominal masses felt. No tenderness.  No edema to lower extremities.           Assessment & Plan:  GERD controlled with Omeprazole. Continue the Omeprazole. Dysphagia: Will get an Esophagram. Further recommendations to follow.

## 2018-10-11 ENCOUNTER — Ambulatory Visit (HOSPITAL_COMMUNITY)
Admission: RE | Admit: 2018-10-11 | Discharge: 2018-10-11 | Disposition: A | Discharge status: Home / Self Care | Payer: Medicare HMO | Source: Ambulatory Visit | Attending: Internal Medicine | Admitting: Internal Medicine

## 2018-10-11 ENCOUNTER — Other Ambulatory Visit (INDEPENDENT_AMBULATORY_CARE_PROVIDER_SITE_OTHER): Payer: Self-pay | Admitting: Internal Medicine

## 2018-10-11 ENCOUNTER — Other Ambulatory Visit: Payer: Self-pay

## 2018-10-11 ENCOUNTER — Telehealth (INDEPENDENT_AMBULATORY_CARE_PROVIDER_SITE_OTHER): Payer: Self-pay | Admitting: Internal Medicine

## 2018-10-11 DIAGNOSIS — K449 Diaphragmatic hernia without obstruction or gangrene: Secondary | ICD-10-CM | POA: Diagnosis not present

## 2018-10-11 DIAGNOSIS — R1319 Other dysphagia: Secondary | ICD-10-CM

## 2018-10-11 DIAGNOSIS — R1310 Dysphagia, unspecified: Secondary | ICD-10-CM | POA: Diagnosis not present

## 2018-10-11 NOTE — Telephone Encounter (Signed)
I received EGD/ED report 10/11/2018 by Dr. Britta Mccreedy.  A hiatal hernia was foun. A schatzki' ring was found. Dilated with an 21mm savory dilator over a guideware.

## 2018-10-13 DIAGNOSIS — E7800 Pure hypercholesterolemia, unspecified: Secondary | ICD-10-CM | POA: Diagnosis not present

## 2018-10-13 DIAGNOSIS — I10 Essential (primary) hypertension: Secondary | ICD-10-CM | POA: Diagnosis not present

## 2018-11-06 DIAGNOSIS — Z1231 Encounter for screening mammogram for malignant neoplasm of breast: Secondary | ICD-10-CM | POA: Diagnosis not present

## 2018-11-13 DIAGNOSIS — I10 Essential (primary) hypertension: Secondary | ICD-10-CM | POA: Diagnosis not present

## 2018-11-13 DIAGNOSIS — E782 Mixed hyperlipidemia: Secondary | ICD-10-CM | POA: Diagnosis not present

## 2018-12-13 DIAGNOSIS — E1122 Type 2 diabetes mellitus with diabetic chronic kidney disease: Secondary | ICD-10-CM | POA: Diagnosis not present

## 2018-12-13 DIAGNOSIS — I10 Essential (primary) hypertension: Secondary | ICD-10-CM | POA: Diagnosis not present

## 2018-12-13 DIAGNOSIS — E1165 Type 2 diabetes mellitus with hyperglycemia: Secondary | ICD-10-CM | POA: Diagnosis not present

## 2019-01-10 ENCOUNTER — Ambulatory Visit (INDEPENDENT_AMBULATORY_CARE_PROVIDER_SITE_OTHER): Payer: Medicare HMO | Admitting: Neurology

## 2019-01-10 ENCOUNTER — Other Ambulatory Visit: Payer: Self-pay

## 2019-01-10 ENCOUNTER — Encounter: Payer: Self-pay | Admitting: Neurology

## 2019-01-10 DIAGNOSIS — R202 Paresthesia of skin: Secondary | ICD-10-CM

## 2019-01-10 NOTE — Procedures (Signed)
     HISTORY:  Katie Wagner is a 78 year old patient with a history of tremors affecting the arms, left greater than right.  The patient reports some numbness of the left foot unassociated with back pain or pain down the leg.  She is being evaluated for this issue.  NERVE CONDUCTION STUDIES:  Nerve conduction studies were performed on the left upper extremity. The distal motor latencies and motor amplitudes for the median and ulnar nerves were within normal limits. The nerve conduction velocities for these nerves were also normal. The sensory latencies for the median and ulnar nerves were normal. The F wave latency for the ulnar nerve was within normal limits.  Nerve conduction studies were performed on the left lower extremity. The distal motor latencies and motor amplitudes for the peroneal and posterior tibial nerves were within normal limits. The nerve conduction velocities for these nerves were also normal. The sensory latencies for the peroneal and sural nerves were within normal limits. The F wave latency for the posterior tibial nerve was within normal limits.   EMG STUDIES:  EMG study was performed on the left lower extremity:  The tibialis anterior muscle reveals 2 to 4K motor units with full recruitment. No fibrillations or positive waves were seen. The peroneus tertius muscle reveals 2 to 4K motor units with full recruitment. No fibrillations or positive waves were seen. The medial gastrocnemius muscle reveals 1 to 3K motor units with full recruitment. No fibrillations or positive waves were seen. The vastus lateralis muscle reveals 2 to 4K motor units with full recruitment. No fibrillations or positive waves were seen. The iliopsoas muscle reveals 2 to 4K motor units with full recruitment. No fibrillations or positive waves were seen. The biceps femoris muscle (long head) reveals 2 to 4K motor units with full recruitment. No fibrillations or positive waves were seen. The  lumbosacral paraspinal muscles were tested at 3 levels, and revealed no abnormalities of insertional activity at all 3 levels tested. There was good relaxation.   IMPRESSION:  Nerve conduction studies done on the left upper and left lower extremities were within normal limits, no evidence of a neuropathy is seen.  EMG evaluation of the left lower extremity was unremarkable, no evidence of an overlying lumbosacral radiculopathy was noted.  Jill Alexanders MD 01/10/2019 2:51 PM  [facility] [address] [place], [place] [address]  Phone [phone] Fax [phone]

## 2019-01-10 NOTE — Progress Notes (Signed)
[place]    Nerve / Sites Muscle Latency Ref. Amplitude Ref. Rel Amp Segments Distance Velocity Ref. Area    ms ms mV mV %  cm m/s m/s mVms  L Median - APB     Wrist APB 3.8 ?4.4 4.4 ?4.0 100 Wrist - APB 7   19.0     Upper arm APB 7.0  4.4  101 Upper arm - Wrist 19 58 ?49 18.9  L Ulnar - ADM     Wrist ADM 2.8 ?3.3 9.1 ?6.0 100 Wrist - ADM 7   29.0     B.Elbow ADM 5.6  8.4  91.7 B.Elbow - Wrist 17 60 ?49 28.8     A.Elbow ADM 7.3  8.2  97.7 A.Elbow - B.Elbow 10 58 ?49 29.2         A.Elbow - Wrist      L Peroneal - EDB     Ankle EDB 4.2 ?6.5 5.1 ?2.0 100 Ankle - EDB 9   18.4     Fib head EDB 9.9  4.8  93 Fib head - Ankle 28 49 ?44 17.4     Pop fossa EDB 11.9  4.2  89.1 Pop fossa - Fib head 10 51 ?44 16.6         Pop fossa - Ankle      L Tibial - AH     Ankle AH 3.7 ?5.8 4.8 ?4.0 100 Ankle - AH 9   11.2     Pop fossa AH 11.8  5.0  104 Pop fossa - Ankle 35 43 ?41 15.1             SNC    Nerve / Sites Rec. Site Peak Lat Ref.  Amp Ref. Segments Distance    ms ms V V  cm  L Sural - Ankle (Calf)     Calf Ankle 2.9 ?4.4 6 ?6 Calf - Ankle 14  L Superficial peroneal - Ankle     Lat leg Ankle 3.5 ?4.4 8 ?6 Lat leg - Ankle 14  L Median - Orthodromic (Dig II, Mid palm)     Dig II Wrist 3.4 ?3.4 13 ?10 Dig II - Wrist 13  L Ulnar - Orthodromic, (Dig V, Mid palm)     Dig V Wrist 2.8 ?3.1 5 ?5 Dig V - Wrist 63              F  Wave    Nerve F Lat Ref.   ms ms  L Ulnar - ADM 26.4 ?32.0  L Tibial - AH 47.1 ?56.0

## 2019-01-10 NOTE — Progress Notes (Signed)
Please refer to EMG and nerve conduction procedure note.  

## 2019-01-12 DIAGNOSIS — E782 Mixed hyperlipidemia: Secondary | ICD-10-CM | POA: Diagnosis not present

## 2019-01-12 DIAGNOSIS — I10 Essential (primary) hypertension: Secondary | ICD-10-CM | POA: Diagnosis not present

## 2019-03-27 DIAGNOSIS — R739 Hyperglycemia, unspecified: Secondary | ICD-10-CM | POA: Diagnosis not present

## 2019-03-27 DIAGNOSIS — I10 Essential (primary) hypertension: Secondary | ICD-10-CM | POA: Diagnosis not present

## 2019-03-27 DIAGNOSIS — E782 Mixed hyperlipidemia: Secondary | ICD-10-CM | POA: Diagnosis not present

## 2019-03-27 DIAGNOSIS — K219 Gastro-esophageal reflux disease without esophagitis: Secondary | ICD-10-CM | POA: Diagnosis not present

## 2019-03-29 DIAGNOSIS — G250 Essential tremor: Secondary | ICD-10-CM | POA: Diagnosis not present

## 2019-03-29 DIAGNOSIS — E782 Mixed hyperlipidemia: Secondary | ICD-10-CM | POA: Diagnosis not present

## 2019-03-29 DIAGNOSIS — K219 Gastro-esophageal reflux disease without esophagitis: Secondary | ICD-10-CM | POA: Diagnosis not present

## 2019-03-29 DIAGNOSIS — I10 Essential (primary) hypertension: Secondary | ICD-10-CM | POA: Diagnosis not present

## 2019-03-29 DIAGNOSIS — J309 Allergic rhinitis, unspecified: Secondary | ICD-10-CM | POA: Diagnosis not present

## 2019-03-29 DIAGNOSIS — Z6828 Body mass index (BMI) 28.0-28.9, adult: Secondary | ICD-10-CM | POA: Diagnosis not present

## 2019-04-13 DIAGNOSIS — E7849 Other hyperlipidemia: Secondary | ICD-10-CM | POA: Diagnosis not present

## 2019-04-13 DIAGNOSIS — I10 Essential (primary) hypertension: Secondary | ICD-10-CM | POA: Diagnosis not present

## 2019-05-11 DIAGNOSIS — I10 Essential (primary) hypertension: Secondary | ICD-10-CM | POA: Diagnosis not present

## 2019-05-11 DIAGNOSIS — E7849 Other hyperlipidemia: Secondary | ICD-10-CM | POA: Diagnosis not present

## 2019-05-22 DIAGNOSIS — L570 Actinic keratosis: Secondary | ICD-10-CM | POA: Diagnosis not present

## 2019-05-22 DIAGNOSIS — L280 Lichen simplex chronicus: Secondary | ICD-10-CM | POA: Diagnosis not present

## 2019-05-22 DIAGNOSIS — L821 Other seborrheic keratosis: Secondary | ICD-10-CM | POA: Diagnosis not present

## 2019-05-22 DIAGNOSIS — L659 Nonscarring hair loss, unspecified: Secondary | ICD-10-CM | POA: Diagnosis not present

## 2019-05-23 DIAGNOSIS — R21 Rash and other nonspecific skin eruption: Secondary | ICD-10-CM | POA: Diagnosis not present

## 2019-06-13 DIAGNOSIS — G250 Essential tremor: Secondary | ICD-10-CM | POA: Diagnosis not present

## 2019-06-13 DIAGNOSIS — I10 Essential (primary) hypertension: Secondary | ICD-10-CM | POA: Diagnosis not present

## 2019-06-13 DIAGNOSIS — K219 Gastro-esophageal reflux disease without esophagitis: Secondary | ICD-10-CM | POA: Diagnosis not present

## 2019-07-23 DIAGNOSIS — G250 Essential tremor: Secondary | ICD-10-CM | POA: Diagnosis not present

## 2019-07-23 DIAGNOSIS — M2550 Pain in unspecified joint: Secondary | ICD-10-CM | POA: Diagnosis not present

## 2019-07-23 DIAGNOSIS — F418 Other specified anxiety disorders: Secondary | ICD-10-CM | POA: Diagnosis not present

## 2019-07-23 DIAGNOSIS — Z6828 Body mass index (BMI) 28.0-28.9, adult: Secondary | ICD-10-CM | POA: Diagnosis not present

## 2019-07-23 DIAGNOSIS — E785 Hyperlipidemia, unspecified: Secondary | ICD-10-CM | POA: Diagnosis not present

## 2019-07-23 DIAGNOSIS — I10 Essential (primary) hypertension: Secondary | ICD-10-CM | POA: Diagnosis not present

## 2019-07-23 DIAGNOSIS — K219 Gastro-esophageal reflux disease without esophagitis: Secondary | ICD-10-CM | POA: Diagnosis not present

## 2019-07-23 DIAGNOSIS — J309 Allergic rhinitis, unspecified: Secondary | ICD-10-CM | POA: Diagnosis not present

## 2019-07-23 DIAGNOSIS — M8580 Other specified disorders of bone density and structure, unspecified site: Secondary | ICD-10-CM | POA: Diagnosis not present

## 2019-09-14 DIAGNOSIS — Z0001 Encounter for general adult medical examination with abnormal findings: Secondary | ICD-10-CM | POA: Diagnosis not present

## 2019-09-14 DIAGNOSIS — I10 Essential (primary) hypertension: Secondary | ICD-10-CM | POA: Diagnosis not present

## 2019-09-14 DIAGNOSIS — R5383 Other fatigue: Secondary | ICD-10-CM | POA: Diagnosis not present

## 2019-09-14 DIAGNOSIS — K219 Gastro-esophageal reflux disease without esophagitis: Secondary | ICD-10-CM | POA: Diagnosis not present

## 2019-09-14 DIAGNOSIS — R739 Hyperglycemia, unspecified: Secondary | ICD-10-CM | POA: Diagnosis not present

## 2019-09-14 DIAGNOSIS — E782 Mixed hyperlipidemia: Secondary | ICD-10-CM | POA: Diagnosis not present

## 2019-09-19 DIAGNOSIS — M8580 Other specified disorders of bone density and structure, unspecified site: Secondary | ICD-10-CM | POA: Diagnosis not present

## 2019-09-19 DIAGNOSIS — Z6829 Body mass index (BMI) 29.0-29.9, adult: Secondary | ICD-10-CM | POA: Diagnosis not present

## 2019-09-19 DIAGNOSIS — Z23 Encounter for immunization: Secondary | ICD-10-CM | POA: Diagnosis not present

## 2019-09-19 DIAGNOSIS — Z0000 Encounter for general adult medical examination without abnormal findings: Secondary | ICD-10-CM | POA: Diagnosis not present

## 2019-09-19 DIAGNOSIS — I10 Essential (primary) hypertension: Secondary | ICD-10-CM | POA: Diagnosis not present

## 2019-09-19 DIAGNOSIS — K219 Gastro-esophageal reflux disease without esophagitis: Secondary | ICD-10-CM | POA: Diagnosis not present

## 2019-09-19 DIAGNOSIS — E782 Mixed hyperlipidemia: Secondary | ICD-10-CM | POA: Diagnosis not present

## 2019-09-26 DIAGNOSIS — H04123 Dry eye syndrome of bilateral lacrimal glands: Secondary | ICD-10-CM | POA: Diagnosis not present

## 2019-09-26 DIAGNOSIS — Z961 Presence of intraocular lens: Secondary | ICD-10-CM | POA: Diagnosis not present

## 2019-10-12 DIAGNOSIS — K219 Gastro-esophageal reflux disease without esophagitis: Secondary | ICD-10-CM | POA: Diagnosis not present

## 2019-10-12 DIAGNOSIS — R739 Hyperglycemia, unspecified: Secondary | ICD-10-CM | POA: Diagnosis not present

## 2019-10-12 DIAGNOSIS — E7849 Other hyperlipidemia: Secondary | ICD-10-CM | POA: Diagnosis not present

## 2019-10-12 DIAGNOSIS — I10 Essential (primary) hypertension: Secondary | ICD-10-CM | POA: Diagnosis not present

## 2019-10-24 DIAGNOSIS — R05 Cough: Secondary | ICD-10-CM | POA: Diagnosis not present

## 2019-10-24 DIAGNOSIS — Z20828 Contact with and (suspected) exposure to other viral communicable diseases: Secondary | ICD-10-CM | POA: Diagnosis not present

## 2019-10-24 DIAGNOSIS — Z6829 Body mass index (BMI) 29.0-29.9, adult: Secondary | ICD-10-CM | POA: Diagnosis not present

## 2019-11-02 DIAGNOSIS — R05 Cough: Secondary | ICD-10-CM | POA: Diagnosis not present

## 2019-11-02 DIAGNOSIS — K760 Fatty (change of) liver, not elsewhere classified: Secondary | ICD-10-CM | POA: Diagnosis not present

## 2019-11-02 DIAGNOSIS — R918 Other nonspecific abnormal finding of lung field: Secondary | ICD-10-CM | POA: Diagnosis not present

## 2019-11-02 DIAGNOSIS — I2510 Atherosclerotic heart disease of native coronary artery without angina pectoris: Secondary | ICD-10-CM | POA: Diagnosis not present

## 2019-11-02 DIAGNOSIS — I700 Atherosclerosis of aorta: Secondary | ICD-10-CM | POA: Diagnosis not present

## 2019-11-02 DIAGNOSIS — J479 Bronchiectasis, uncomplicated: Secondary | ICD-10-CM | POA: Diagnosis not present

## 2019-11-02 DIAGNOSIS — R0602 Shortness of breath: Secondary | ICD-10-CM | POA: Diagnosis not present

## 2019-11-05 DIAGNOSIS — R9389 Abnormal findings on diagnostic imaging of other specified body structures: Secondary | ICD-10-CM | POA: Diagnosis not present

## 2019-11-05 DIAGNOSIS — Z6829 Body mass index (BMI) 29.0-29.9, adult: Secondary | ICD-10-CM | POA: Diagnosis not present

## 2019-11-08 DIAGNOSIS — R251 Tremor, unspecified: Secondary | ICD-10-CM | POA: Diagnosis not present

## 2019-11-08 DIAGNOSIS — C3481 Malignant neoplasm of overlapping sites of right bronchus and lung: Secondary | ICD-10-CM | POA: Diagnosis not present

## 2019-11-08 DIAGNOSIS — G969 Disorder of central nervous system, unspecified: Secondary | ICD-10-CM | POA: Diagnosis not present

## 2019-11-12 ENCOUNTER — Other Ambulatory Visit: Payer: Self-pay | Admitting: Oncology

## 2019-11-12 ENCOUNTER — Other Ambulatory Visit (HOSPITAL_COMMUNITY): Payer: Self-pay | Admitting: Oncology

## 2019-11-12 DIAGNOSIS — C3481 Malignant neoplasm of overlapping sites of right bronchus and lung: Secondary | ICD-10-CM

## 2019-11-13 DIAGNOSIS — R739 Hyperglycemia, unspecified: Secondary | ICD-10-CM | POA: Diagnosis not present

## 2019-11-13 DIAGNOSIS — I10 Essential (primary) hypertension: Secondary | ICD-10-CM | POA: Diagnosis not present

## 2019-11-13 DIAGNOSIS — K219 Gastro-esophageal reflux disease without esophagitis: Secondary | ICD-10-CM | POA: Diagnosis not present

## 2019-11-13 DIAGNOSIS — E7849 Other hyperlipidemia: Secondary | ICD-10-CM | POA: Diagnosis not present

## 2019-11-17 DIAGNOSIS — Z20828 Contact with and (suspected) exposure to other viral communicable diseases: Secondary | ICD-10-CM | POA: Diagnosis not present

## 2019-11-20 ENCOUNTER — Other Ambulatory Visit: Payer: Self-pay

## 2019-11-20 ENCOUNTER — Ambulatory Visit (HOSPITAL_COMMUNITY)
Admission: RE | Admit: 2019-11-20 | Discharge: 2019-11-20 | Disposition: A | Discharge status: Home / Self Care | Payer: Medicare HMO | Source: Ambulatory Visit | Attending: Oncology | Admitting: Oncology

## 2019-11-20 DIAGNOSIS — R911 Solitary pulmonary nodule: Secondary | ICD-10-CM | POA: Diagnosis not present

## 2019-11-20 DIAGNOSIS — E041 Nontoxic single thyroid nodule: Secondary | ICD-10-CM | POA: Diagnosis not present

## 2019-11-20 DIAGNOSIS — C3481 Malignant neoplasm of overlapping sites of right bronchus and lung: Secondary | ICD-10-CM | POA: Diagnosis not present

## 2019-11-20 LAB — GLUCOSE, CAPILLARY: Glucose-Capillary: 97 mg/dL (ref 70–99)

## 2019-11-20 MED ORDER — FLUDEOXYGLUCOSE F - 18 (FDG) INJECTION
8.6100 | Freq: Once | INTRAVENOUS | Status: AC | PRN
  Administered 2019-11-20: 8.61 via INTRAVENOUS

## 2019-11-28 DIAGNOSIS — C3490 Malignant neoplasm of unspecified part of unspecified bronchus or lung: Secondary | ICD-10-CM | POA: Diagnosis not present

## 2019-11-28 DIAGNOSIS — G969 Disorder of central nervous system, unspecified: Secondary | ICD-10-CM | POA: Diagnosis not present

## 2019-11-28 DIAGNOSIS — C3481 Malignant neoplasm of overlapping sites of right bronchus and lung: Secondary | ICD-10-CM | POA: Diagnosis not present

## 2019-11-28 DIAGNOSIS — D329 Benign neoplasm of meninges, unspecified: Secondary | ICD-10-CM | POA: Diagnosis not present

## 2019-11-28 DIAGNOSIS — R251 Tremor, unspecified: Secondary | ICD-10-CM | POA: Diagnosis not present

## 2019-11-29 DIAGNOSIS — M8580 Other specified disorders of bone density and structure, unspecified site: Secondary | ICD-10-CM | POA: Diagnosis not present

## 2019-11-29 DIAGNOSIS — F419 Anxiety disorder, unspecified: Secondary | ICD-10-CM | POA: Diagnosis not present

## 2019-11-29 DIAGNOSIS — K219 Gastro-esophageal reflux disease without esophagitis: Secondary | ICD-10-CM | POA: Diagnosis not present

## 2019-11-29 DIAGNOSIS — Z79899 Other long term (current) drug therapy: Secondary | ICD-10-CM | POA: Diagnosis not present

## 2019-11-29 DIAGNOSIS — C3481 Malignant neoplasm of overlapping sites of right bronchus and lung: Secondary | ICD-10-CM | POA: Diagnosis not present

## 2019-11-29 DIAGNOSIS — Z23 Encounter for immunization: Secondary | ICD-10-CM | POA: Diagnosis not present

## 2019-11-29 DIAGNOSIS — D329 Benign neoplasm of meninges, unspecified: Secondary | ICD-10-CM | POA: Diagnosis not present

## 2019-11-29 DIAGNOSIS — K228 Other specified diseases of esophagus: Secondary | ICD-10-CM | POA: Diagnosis not present

## 2019-11-29 DIAGNOSIS — R918 Other nonspecific abnormal finding of lung field: Secondary | ICD-10-CM | POA: Diagnosis not present

## 2019-11-29 DIAGNOSIS — I10 Essential (primary) hypertension: Secondary | ICD-10-CM | POA: Diagnosis not present

## 2019-12-04 DIAGNOSIS — C3481 Malignant neoplasm of overlapping sites of right bronchus and lung: Secondary | ICD-10-CM | POA: Diagnosis not present

## 2019-12-04 DIAGNOSIS — E041 Nontoxic single thyroid nodule: Secondary | ICD-10-CM | POA: Diagnosis not present

## 2019-12-18 DIAGNOSIS — C3481 Malignant neoplasm of overlapping sites of right bronchus and lung: Secondary | ICD-10-CM | POA: Diagnosis not present

## 2019-12-18 DIAGNOSIS — D34 Benign neoplasm of thyroid gland: Secondary | ICD-10-CM | POA: Diagnosis not present

## 2019-12-18 DIAGNOSIS — D329 Benign neoplasm of meninges, unspecified: Secondary | ICD-10-CM | POA: Diagnosis not present

## 2019-12-18 DIAGNOSIS — Z7189 Other specified counseling: Secondary | ICD-10-CM | POA: Diagnosis not present

## 2019-12-20 DIAGNOSIS — C3481 Malignant neoplasm of overlapping sites of right bronchus and lung: Secondary | ICD-10-CM | POA: Diagnosis not present

## 2019-12-20 DIAGNOSIS — G939 Disorder of brain, unspecified: Secondary | ICD-10-CM | POA: Diagnosis not present

## 2019-12-27 DIAGNOSIS — C3481 Malignant neoplasm of overlapping sites of right bronchus and lung: Secondary | ICD-10-CM | POA: Diagnosis not present

## 2019-12-28 DIAGNOSIS — Z800 Family history of malignant neoplasm of digestive organs: Secondary | ICD-10-CM | POA: Diagnosis not present

## 2019-12-28 DIAGNOSIS — F419 Anxiety disorder, unspecified: Secondary | ICD-10-CM | POA: Diagnosis not present

## 2019-12-28 DIAGNOSIS — Z7983 Long term (current) use of bisphosphonates: Secondary | ICD-10-CM | POA: Diagnosis not present

## 2019-12-28 DIAGNOSIS — Z79899 Other long term (current) drug therapy: Secondary | ICD-10-CM | POA: Diagnosis not present

## 2019-12-28 DIAGNOSIS — I10 Essential (primary) hypertension: Secondary | ICD-10-CM | POA: Diagnosis not present

## 2019-12-28 DIAGNOSIS — C3481 Malignant neoplasm of overlapping sites of right bronchus and lung: Secondary | ICD-10-CM | POA: Diagnosis not present

## 2019-12-28 DIAGNOSIS — Z808 Family history of malignant neoplasm of other organs or systems: Secondary | ICD-10-CM | POA: Diagnosis not present

## 2019-12-28 DIAGNOSIS — Z87891 Personal history of nicotine dependence: Secondary | ICD-10-CM | POA: Diagnosis not present

## 2019-12-28 DIAGNOSIS — K219 Gastro-esophageal reflux disease without esophagitis: Secondary | ICD-10-CM | POA: Diagnosis not present

## 2019-12-28 DIAGNOSIS — C342 Malignant neoplasm of middle lobe, bronchus or lung: Secondary | ICD-10-CM | POA: Diagnosis not present

## 2019-12-28 DIAGNOSIS — E041 Nontoxic single thyroid nodule: Secondary | ICD-10-CM | POA: Diagnosis not present

## 2020-01-12 DIAGNOSIS — E7849 Other hyperlipidemia: Secondary | ICD-10-CM | POA: Diagnosis not present

## 2020-01-12 DIAGNOSIS — R739 Hyperglycemia, unspecified: Secondary | ICD-10-CM | POA: Diagnosis not present

## 2020-01-12 DIAGNOSIS — K219 Gastro-esophageal reflux disease without esophagitis: Secondary | ICD-10-CM | POA: Diagnosis not present

## 2020-01-12 DIAGNOSIS — I10 Essential (primary) hypertension: Secondary | ICD-10-CM | POA: Diagnosis not present

## 2020-01-14 DIAGNOSIS — Z20828 Contact with and (suspected) exposure to other viral communicable diseases: Secondary | ICD-10-CM | POA: Diagnosis not present

## 2020-01-16 DIAGNOSIS — Z87891 Personal history of nicotine dependence: Secondary | ICD-10-CM | POA: Diagnosis not present

## 2020-01-16 DIAGNOSIS — E041 Nontoxic single thyroid nodule: Secondary | ICD-10-CM | POA: Diagnosis not present

## 2020-01-16 DIAGNOSIS — J9811 Atelectasis: Secondary | ICD-10-CM | POA: Diagnosis not present

## 2020-01-16 DIAGNOSIS — C73 Malignant neoplasm of thyroid gland: Secondary | ICD-10-CM | POA: Diagnosis not present

## 2020-01-16 DIAGNOSIS — C771 Secondary and unspecified malignant neoplasm of intrathoracic lymph nodes: Secondary | ICD-10-CM | POA: Diagnosis not present

## 2020-01-16 DIAGNOSIS — C3491 Malignant neoplasm of unspecified part of right bronchus or lung: Secondary | ICD-10-CM | POA: Diagnosis not present

## 2020-01-16 DIAGNOSIS — F32A Depression, unspecified: Secondary | ICD-10-CM | POA: Diagnosis not present

## 2020-01-16 DIAGNOSIS — C342 Malignant neoplasm of middle lobe, bronchus or lung: Secondary | ICD-10-CM | POA: Diagnosis not present

## 2020-01-16 DIAGNOSIS — K219 Gastro-esophageal reflux disease without esophagitis: Secondary | ICD-10-CM | POA: Diagnosis not present

## 2020-01-16 DIAGNOSIS — J90 Pleural effusion, not elsewhere classified: Secondary | ICD-10-CM | POA: Diagnosis not present

## 2020-01-16 DIAGNOSIS — I10 Essential (primary) hypertension: Secondary | ICD-10-CM | POA: Diagnosis not present

## 2020-01-16 DIAGNOSIS — J302 Other seasonal allergic rhinitis: Secondary | ICD-10-CM | POA: Diagnosis not present

## 2020-01-16 DIAGNOSIS — R911 Solitary pulmonary nodule: Secondary | ICD-10-CM | POA: Diagnosis not present

## 2020-01-16 DIAGNOSIS — Z4682 Encounter for fitting and adjustment of non-vascular catheter: Secondary | ICD-10-CM | POA: Diagnosis not present

## 2020-01-16 DIAGNOSIS — C3481 Malignant neoplasm of overlapping sites of right bronchus and lung: Secondary | ICD-10-CM | POA: Diagnosis not present

## 2020-01-16 DIAGNOSIS — T797XXA Traumatic subcutaneous emphysema, initial encounter: Secondary | ICD-10-CM | POA: Diagnosis not present

## 2020-01-16 DIAGNOSIS — F419 Anxiety disorder, unspecified: Secondary | ICD-10-CM | POA: Diagnosis not present

## 2020-01-17 DIAGNOSIS — Z4682 Encounter for fitting and adjustment of non-vascular catheter: Secondary | ICD-10-CM | POA: Diagnosis not present

## 2020-01-18 DIAGNOSIS — C3481 Malignant neoplasm of overlapping sites of right bronchus and lung: Secondary | ICD-10-CM | POA: Diagnosis not present

## 2020-01-18 DIAGNOSIS — J90 Pleural effusion, not elsewhere classified: Secondary | ICD-10-CM | POA: Diagnosis not present

## 2020-01-18 DIAGNOSIS — R911 Solitary pulmonary nodule: Secondary | ICD-10-CM | POA: Diagnosis not present

## 2020-01-18 DIAGNOSIS — Z4682 Encounter for fitting and adjustment of non-vascular catheter: Secondary | ICD-10-CM | POA: Diagnosis not present

## 2020-01-20 DIAGNOSIS — R0902 Hypoxemia: Secondary | ICD-10-CM | POA: Diagnosis not present

## 2020-01-20 DIAGNOSIS — I10 Essential (primary) hypertension: Secondary | ICD-10-CM | POA: Diagnosis not present

## 2020-01-20 DIAGNOSIS — R58 Hemorrhage, not elsewhere classified: Secondary | ICD-10-CM | POA: Diagnosis not present

## 2020-01-20 DIAGNOSIS — Z87891 Personal history of nicotine dependence: Secondary | ICD-10-CM | POA: Diagnosis not present

## 2020-01-20 DIAGNOSIS — Z800 Family history of malignant neoplasm of digestive organs: Secondary | ICD-10-CM | POA: Diagnosis not present

## 2020-01-20 DIAGNOSIS — K219 Gastro-esophageal reflux disease without esophagitis: Secondary | ICD-10-CM | POA: Diagnosis not present

## 2020-01-20 DIAGNOSIS — R319 Hematuria, unspecified: Secondary | ICD-10-CM | POA: Diagnosis not present

## 2020-01-20 DIAGNOSIS — N3001 Acute cystitis with hematuria: Secondary | ICD-10-CM | POA: Diagnosis not present

## 2020-01-20 DIAGNOSIS — I700 Atherosclerosis of aorta: Secondary | ICD-10-CM | POA: Diagnosis not present

## 2020-01-20 DIAGNOSIS — J9811 Atelectasis: Secondary | ICD-10-CM | POA: Diagnosis not present

## 2020-01-20 DIAGNOSIS — I2510 Atherosclerotic heart disease of native coronary artery without angina pectoris: Secondary | ICD-10-CM | POA: Diagnosis not present

## 2020-01-20 DIAGNOSIS — J90 Pleural effusion, not elsewhere classified: Secondary | ICD-10-CM | POA: Diagnosis not present

## 2020-01-20 DIAGNOSIS — N390 Urinary tract infection, site not specified: Secondary | ICD-10-CM | POA: Diagnosis not present

## 2020-01-20 DIAGNOSIS — F418 Other specified anxiety disorders: Secondary | ICD-10-CM | POA: Diagnosis not present

## 2020-01-20 DIAGNOSIS — R509 Fever, unspecified: Secondary | ICD-10-CM | POA: Diagnosis not present

## 2020-01-20 DIAGNOSIS — T83511A Infection and inflammatory reaction due to indwelling urethral catheter, initial encounter: Secondary | ICD-10-CM | POA: Diagnosis not present

## 2020-01-20 DIAGNOSIS — E785 Hyperlipidemia, unspecified: Secondary | ICD-10-CM | POA: Diagnosis not present

## 2020-01-21 DIAGNOSIS — N390 Urinary tract infection, site not specified: Secondary | ICD-10-CM | POA: Diagnosis not present

## 2020-01-21 DIAGNOSIS — K219 Gastro-esophageal reflux disease without esophagitis: Secondary | ICD-10-CM | POA: Diagnosis not present

## 2020-01-21 DIAGNOSIS — E785 Hyperlipidemia, unspecified: Secondary | ICD-10-CM | POA: Diagnosis not present

## 2020-01-21 DIAGNOSIS — F418 Other specified anxiety disorders: Secondary | ICD-10-CM | POA: Diagnosis not present

## 2020-01-22 DIAGNOSIS — T83511A Infection and inflammatory reaction due to indwelling urethral catheter, initial encounter: Secondary | ICD-10-CM | POA: Diagnosis not present

## 2020-01-22 DIAGNOSIS — N390 Urinary tract infection, site not specified: Secondary | ICD-10-CM | POA: Diagnosis not present

## 2020-01-26 DIAGNOSIS — C3481 Malignant neoplasm of overlapping sites of right bronchus and lung: Secondary | ICD-10-CM | POA: Diagnosis not present

## 2020-01-26 DIAGNOSIS — I447 Left bundle-branch block, unspecified: Secondary | ICD-10-CM | POA: Diagnosis not present

## 2020-01-26 DIAGNOSIS — Z483 Aftercare following surgery for neoplasm: Secondary | ICD-10-CM | POA: Diagnosis not present

## 2020-01-26 DIAGNOSIS — D497 Neoplasm of unspecified behavior of endocrine glands and other parts of nervous system: Secondary | ICD-10-CM | POA: Diagnosis not present

## 2020-01-26 DIAGNOSIS — T83511A Infection and inflammatory reaction due to indwelling urethral catheter, initial encounter: Secondary | ICD-10-CM | POA: Diagnosis not present

## 2020-01-26 DIAGNOSIS — C342 Malignant neoplasm of middle lobe, bronchus or lung: Secondary | ICD-10-CM | POA: Diagnosis not present

## 2020-01-26 DIAGNOSIS — J90 Pleural effusion, not elsewhere classified: Secondary | ICD-10-CM | POA: Diagnosis not present

## 2020-01-26 DIAGNOSIS — I119 Hypertensive heart disease without heart failure: Secondary | ICD-10-CM | POA: Diagnosis not present

## 2020-01-26 DIAGNOSIS — I2510 Atherosclerotic heart disease of native coronary artery without angina pectoris: Secondary | ICD-10-CM | POA: Diagnosis not present

## 2020-01-26 DIAGNOSIS — N3001 Acute cystitis with hematuria: Secondary | ICD-10-CM | POA: Diagnosis not present

## 2020-01-29 DIAGNOSIS — N3001 Acute cystitis with hematuria: Secondary | ICD-10-CM | POA: Diagnosis not present

## 2020-01-29 DIAGNOSIS — I119 Hypertensive heart disease without heart failure: Secondary | ICD-10-CM | POA: Diagnosis not present

## 2020-01-29 DIAGNOSIS — J90 Pleural effusion, not elsewhere classified: Secondary | ICD-10-CM | POA: Diagnosis not present

## 2020-01-29 DIAGNOSIS — D497 Neoplasm of unspecified behavior of endocrine glands and other parts of nervous system: Secondary | ICD-10-CM | POA: Diagnosis not present

## 2020-01-29 DIAGNOSIS — I2510 Atherosclerotic heart disease of native coronary artery without angina pectoris: Secondary | ICD-10-CM | POA: Diagnosis not present

## 2020-01-29 DIAGNOSIS — I447 Left bundle-branch block, unspecified: Secondary | ICD-10-CM | POA: Diagnosis not present

## 2020-01-29 DIAGNOSIS — T83511A Infection and inflammatory reaction due to indwelling urethral catheter, initial encounter: Secondary | ICD-10-CM | POA: Diagnosis not present

## 2020-01-29 DIAGNOSIS — Z483 Aftercare following surgery for neoplasm: Secondary | ICD-10-CM | POA: Diagnosis not present

## 2020-01-29 DIAGNOSIS — C3481 Malignant neoplasm of overlapping sites of right bronchus and lung: Secondary | ICD-10-CM | POA: Diagnosis not present

## 2020-01-30 DIAGNOSIS — J9811 Atelectasis: Secondary | ICD-10-CM | POA: Diagnosis not present

## 2020-01-30 DIAGNOSIS — C342 Malignant neoplasm of middle lobe, bronchus or lung: Secondary | ICD-10-CM | POA: Diagnosis not present

## 2020-01-30 DIAGNOSIS — J90 Pleural effusion, not elsewhere classified: Secondary | ICD-10-CM | POA: Diagnosis not present

## 2020-01-30 DIAGNOSIS — E041 Nontoxic single thyroid nodule: Secondary | ICD-10-CM | POA: Diagnosis not present

## 2020-01-30 DIAGNOSIS — F17210 Nicotine dependence, cigarettes, uncomplicated: Secondary | ICD-10-CM | POA: Diagnosis not present

## 2020-01-31 DIAGNOSIS — D497 Neoplasm of unspecified behavior of endocrine glands and other parts of nervous system: Secondary | ICD-10-CM | POA: Diagnosis not present

## 2020-01-31 DIAGNOSIS — I447 Left bundle-branch block, unspecified: Secondary | ICD-10-CM | POA: Diagnosis not present

## 2020-01-31 DIAGNOSIS — J90 Pleural effusion, not elsewhere classified: Secondary | ICD-10-CM | POA: Diagnosis not present

## 2020-01-31 DIAGNOSIS — Z483 Aftercare following surgery for neoplasm: Secondary | ICD-10-CM | POA: Diagnosis not present

## 2020-01-31 DIAGNOSIS — T83511A Infection and inflammatory reaction due to indwelling urethral catheter, initial encounter: Secondary | ICD-10-CM | POA: Diagnosis not present

## 2020-01-31 DIAGNOSIS — N3001 Acute cystitis with hematuria: Secondary | ICD-10-CM | POA: Diagnosis not present

## 2020-01-31 DIAGNOSIS — C3481 Malignant neoplasm of overlapping sites of right bronchus and lung: Secondary | ICD-10-CM | POA: Diagnosis not present

## 2020-01-31 DIAGNOSIS — I2510 Atherosclerotic heart disease of native coronary artery without angina pectoris: Secondary | ICD-10-CM | POA: Diagnosis not present

## 2020-01-31 DIAGNOSIS — I119 Hypertensive heart disease without heart failure: Secondary | ICD-10-CM | POA: Diagnosis not present

## 2020-02-01 DIAGNOSIS — D497 Neoplasm of unspecified behavior of endocrine glands and other parts of nervous system: Secondary | ICD-10-CM | POA: Diagnosis not present

## 2020-02-01 DIAGNOSIS — Z483 Aftercare following surgery for neoplasm: Secondary | ICD-10-CM | POA: Diagnosis not present

## 2020-02-01 DIAGNOSIS — T83511A Infection and inflammatory reaction due to indwelling urethral catheter, initial encounter: Secondary | ICD-10-CM | POA: Diagnosis not present

## 2020-02-01 DIAGNOSIS — I2510 Atherosclerotic heart disease of native coronary artery without angina pectoris: Secondary | ICD-10-CM | POA: Diagnosis not present

## 2020-02-01 DIAGNOSIS — J90 Pleural effusion, not elsewhere classified: Secondary | ICD-10-CM | POA: Diagnosis not present

## 2020-02-01 DIAGNOSIS — R251 Tremor, unspecified: Secondary | ICD-10-CM | POA: Diagnosis not present

## 2020-02-01 DIAGNOSIS — Z9889 Other specified postprocedural states: Secondary | ICD-10-CM | POA: Diagnosis not present

## 2020-02-01 DIAGNOSIS — C342 Malignant neoplasm of middle lobe, bronchus or lung: Secondary | ICD-10-CM | POA: Diagnosis not present

## 2020-02-01 DIAGNOSIS — Z7189 Other specified counseling: Secondary | ICD-10-CM | POA: Diagnosis not present

## 2020-02-01 DIAGNOSIS — C3481 Malignant neoplasm of overlapping sites of right bronchus and lung: Secondary | ICD-10-CM | POA: Diagnosis not present

## 2020-02-01 DIAGNOSIS — I119 Hypertensive heart disease without heart failure: Secondary | ICD-10-CM | POA: Diagnosis not present

## 2020-02-01 DIAGNOSIS — N3001 Acute cystitis with hematuria: Secondary | ICD-10-CM | POA: Diagnosis not present

## 2020-02-01 DIAGNOSIS — G969 Disorder of central nervous system, unspecified: Secondary | ICD-10-CM | POA: Diagnosis not present

## 2020-02-01 DIAGNOSIS — I447 Left bundle-branch block, unspecified: Secondary | ICD-10-CM | POA: Diagnosis not present

## 2020-02-01 DIAGNOSIS — J9811 Atelectasis: Secondary | ICD-10-CM | POA: Diagnosis not present

## 2020-02-01 DIAGNOSIS — D34 Benign neoplasm of thyroid gland: Secondary | ICD-10-CM | POA: Diagnosis not present

## 2020-02-04 DIAGNOSIS — I2510 Atherosclerotic heart disease of native coronary artery without angina pectoris: Secondary | ICD-10-CM | POA: Diagnosis not present

## 2020-02-04 DIAGNOSIS — C3481 Malignant neoplasm of overlapping sites of right bronchus and lung: Secondary | ICD-10-CM | POA: Diagnosis not present

## 2020-02-04 DIAGNOSIS — Z483 Aftercare following surgery for neoplasm: Secondary | ICD-10-CM | POA: Diagnosis not present

## 2020-02-04 DIAGNOSIS — T83511A Infection and inflammatory reaction due to indwelling urethral catheter, initial encounter: Secondary | ICD-10-CM | POA: Diagnosis not present

## 2020-02-04 DIAGNOSIS — D497 Neoplasm of unspecified behavior of endocrine glands and other parts of nervous system: Secondary | ICD-10-CM | POA: Diagnosis not present

## 2020-02-04 DIAGNOSIS — I447 Left bundle-branch block, unspecified: Secondary | ICD-10-CM | POA: Diagnosis not present

## 2020-02-04 DIAGNOSIS — I119 Hypertensive heart disease without heart failure: Secondary | ICD-10-CM | POA: Diagnosis not present

## 2020-02-04 DIAGNOSIS — J90 Pleural effusion, not elsewhere classified: Secondary | ICD-10-CM | POA: Diagnosis not present

## 2020-02-04 DIAGNOSIS — N3001 Acute cystitis with hematuria: Secondary | ICD-10-CM | POA: Diagnosis not present

## 2020-02-08 DIAGNOSIS — T83511A Infection and inflammatory reaction due to indwelling urethral catheter, initial encounter: Secondary | ICD-10-CM | POA: Diagnosis not present

## 2020-02-08 DIAGNOSIS — J90 Pleural effusion, not elsewhere classified: Secondary | ICD-10-CM | POA: Diagnosis not present

## 2020-02-08 DIAGNOSIS — I447 Left bundle-branch block, unspecified: Secondary | ICD-10-CM | POA: Diagnosis not present

## 2020-02-08 DIAGNOSIS — I2510 Atherosclerotic heart disease of native coronary artery without angina pectoris: Secondary | ICD-10-CM | POA: Diagnosis not present

## 2020-02-08 DIAGNOSIS — N3001 Acute cystitis with hematuria: Secondary | ICD-10-CM | POA: Diagnosis not present

## 2020-02-08 DIAGNOSIS — Z483 Aftercare following surgery for neoplasm: Secondary | ICD-10-CM | POA: Diagnosis not present

## 2020-02-08 DIAGNOSIS — D497 Neoplasm of unspecified behavior of endocrine glands and other parts of nervous system: Secondary | ICD-10-CM | POA: Diagnosis not present

## 2020-02-08 DIAGNOSIS — I119 Hypertensive heart disease without heart failure: Secondary | ICD-10-CM | POA: Diagnosis not present

## 2020-02-08 DIAGNOSIS — C3481 Malignant neoplasm of overlapping sites of right bronchus and lung: Secondary | ICD-10-CM | POA: Diagnosis not present

## 2020-02-11 DIAGNOSIS — J90 Pleural effusion, not elsewhere classified: Secondary | ICD-10-CM | POA: Diagnosis not present

## 2020-02-11 DIAGNOSIS — I447 Left bundle-branch block, unspecified: Secondary | ICD-10-CM | POA: Diagnosis not present

## 2020-02-11 DIAGNOSIS — I2510 Atherosclerotic heart disease of native coronary artery without angina pectoris: Secondary | ICD-10-CM | POA: Diagnosis not present

## 2020-02-11 DIAGNOSIS — I119 Hypertensive heart disease without heart failure: Secondary | ICD-10-CM | POA: Diagnosis not present

## 2020-02-11 DIAGNOSIS — N3001 Acute cystitis with hematuria: Secondary | ICD-10-CM | POA: Diagnosis not present

## 2020-02-11 DIAGNOSIS — D497 Neoplasm of unspecified behavior of endocrine glands and other parts of nervous system: Secondary | ICD-10-CM | POA: Diagnosis not present

## 2020-02-11 DIAGNOSIS — Z483 Aftercare following surgery for neoplasm: Secondary | ICD-10-CM | POA: Diagnosis not present

## 2020-02-11 DIAGNOSIS — C3481 Malignant neoplasm of overlapping sites of right bronchus and lung: Secondary | ICD-10-CM | POA: Diagnosis not present

## 2020-02-11 DIAGNOSIS — T83511A Infection and inflammatory reaction due to indwelling urethral catheter, initial encounter: Secondary | ICD-10-CM | POA: Diagnosis not present

## 2020-02-12 DIAGNOSIS — I10 Essential (primary) hypertension: Secondary | ICD-10-CM | POA: Diagnosis not present

## 2020-02-12 DIAGNOSIS — C342 Malignant neoplasm of middle lobe, bronchus or lung: Secondary | ICD-10-CM | POA: Diagnosis not present

## 2020-02-12 DIAGNOSIS — E7849 Other hyperlipidemia: Secondary | ICD-10-CM | POA: Diagnosis not present

## 2020-02-12 DIAGNOSIS — C3481 Malignant neoplasm of overlapping sites of right bronchus and lung: Secondary | ICD-10-CM | POA: Diagnosis not present

## 2020-02-12 DIAGNOSIS — K219 Gastro-esophageal reflux disease without esophagitis: Secondary | ICD-10-CM | POA: Diagnosis not present

## 2020-02-13 DIAGNOSIS — I447 Left bundle-branch block, unspecified: Secondary | ICD-10-CM | POA: Diagnosis not present

## 2020-02-13 DIAGNOSIS — I119 Hypertensive heart disease without heart failure: Secondary | ICD-10-CM | POA: Diagnosis not present

## 2020-02-13 DIAGNOSIS — T83511A Infection and inflammatory reaction due to indwelling urethral catheter, initial encounter: Secondary | ICD-10-CM | POA: Diagnosis not present

## 2020-02-13 DIAGNOSIS — J90 Pleural effusion, not elsewhere classified: Secondary | ICD-10-CM | POA: Diagnosis not present

## 2020-02-13 DIAGNOSIS — I2510 Atherosclerotic heart disease of native coronary artery without angina pectoris: Secondary | ICD-10-CM | POA: Diagnosis not present

## 2020-02-13 DIAGNOSIS — Z483 Aftercare following surgery for neoplasm: Secondary | ICD-10-CM | POA: Diagnosis not present

## 2020-02-13 DIAGNOSIS — D497 Neoplasm of unspecified behavior of endocrine glands and other parts of nervous system: Secondary | ICD-10-CM | POA: Diagnosis not present

## 2020-02-13 DIAGNOSIS — C3481 Malignant neoplasm of overlapping sites of right bronchus and lung: Secondary | ICD-10-CM | POA: Diagnosis not present

## 2020-02-13 DIAGNOSIS — N3001 Acute cystitis with hematuria: Secondary | ICD-10-CM | POA: Diagnosis not present

## 2020-02-14 DIAGNOSIS — I447 Left bundle-branch block, unspecified: Secondary | ICD-10-CM | POA: Diagnosis not present

## 2020-02-14 DIAGNOSIS — T83511A Infection and inflammatory reaction due to indwelling urethral catheter, initial encounter: Secondary | ICD-10-CM | POA: Diagnosis not present

## 2020-02-14 DIAGNOSIS — J90 Pleural effusion, not elsewhere classified: Secondary | ICD-10-CM | POA: Diagnosis not present

## 2020-02-14 DIAGNOSIS — I2510 Atherosclerotic heart disease of native coronary artery without angina pectoris: Secondary | ICD-10-CM | POA: Diagnosis not present

## 2020-02-14 DIAGNOSIS — Z483 Aftercare following surgery for neoplasm: Secondary | ICD-10-CM | POA: Diagnosis not present

## 2020-02-14 DIAGNOSIS — C3481 Malignant neoplasm of overlapping sites of right bronchus and lung: Secondary | ICD-10-CM | POA: Diagnosis not present

## 2020-02-14 DIAGNOSIS — N3001 Acute cystitis with hematuria: Secondary | ICD-10-CM | POA: Diagnosis not present

## 2020-02-14 DIAGNOSIS — D497 Neoplasm of unspecified behavior of endocrine glands and other parts of nervous system: Secondary | ICD-10-CM | POA: Diagnosis not present

## 2020-02-14 DIAGNOSIS — I119 Hypertensive heart disease without heart failure: Secondary | ICD-10-CM | POA: Diagnosis not present

## 2020-02-15 DIAGNOSIS — Z01818 Encounter for other preprocedural examination: Secondary | ICD-10-CM | POA: Diagnosis not present

## 2020-02-15 DIAGNOSIS — Z20822 Contact with and (suspected) exposure to covid-19: Secondary | ICD-10-CM | POA: Diagnosis not present

## 2020-02-15 DIAGNOSIS — Z01812 Encounter for preprocedural laboratory examination: Secondary | ICD-10-CM | POA: Diagnosis not present

## 2020-02-15 DIAGNOSIS — C342 Malignant neoplasm of middle lobe, bronchus or lung: Secondary | ICD-10-CM | POA: Diagnosis not present

## 2020-02-17 DIAGNOSIS — C3481 Malignant neoplasm of overlapping sites of right bronchus and lung: Secondary | ICD-10-CM | POA: Diagnosis not present

## 2020-02-17 DIAGNOSIS — R911 Solitary pulmonary nodule: Secondary | ICD-10-CM | POA: Diagnosis not present

## 2020-02-18 DIAGNOSIS — Z79899 Other long term (current) drug therapy: Secondary | ICD-10-CM | POA: Diagnosis not present

## 2020-02-18 DIAGNOSIS — Z7983 Long term (current) use of bisphosphonates: Secondary | ICD-10-CM | POA: Diagnosis not present

## 2020-02-18 DIAGNOSIS — K219 Gastro-esophageal reflux disease without esophagitis: Secondary | ICD-10-CM | POA: Diagnosis not present

## 2020-02-18 DIAGNOSIS — I10 Essential (primary) hypertension: Secondary | ICD-10-CM | POA: Diagnosis not present

## 2020-02-18 DIAGNOSIS — Z79890 Hormone replacement therapy (postmenopausal): Secondary | ICD-10-CM | POA: Diagnosis not present

## 2020-02-18 DIAGNOSIS — C3490 Malignant neoplasm of unspecified part of unspecified bronchus or lung: Secondary | ICD-10-CM | POA: Diagnosis not present

## 2020-02-18 DIAGNOSIS — C3491 Malignant neoplasm of unspecified part of right bronchus or lung: Secondary | ICD-10-CM | POA: Diagnosis not present

## 2020-02-18 DIAGNOSIS — Z87891 Personal history of nicotine dependence: Secondary | ICD-10-CM | POA: Diagnosis not present

## 2020-02-18 DIAGNOSIS — F419 Anxiety disorder, unspecified: Secondary | ICD-10-CM | POA: Diagnosis not present

## 2020-02-18 DIAGNOSIS — E119 Type 2 diabetes mellitus without complications: Secondary | ICD-10-CM | POA: Diagnosis not present

## 2020-02-19 DIAGNOSIS — T83511A Infection and inflammatory reaction due to indwelling urethral catheter, initial encounter: Secondary | ICD-10-CM | POA: Diagnosis not present

## 2020-02-19 DIAGNOSIS — D497 Neoplasm of unspecified behavior of endocrine glands and other parts of nervous system: Secondary | ICD-10-CM | POA: Diagnosis not present

## 2020-02-19 DIAGNOSIS — J90 Pleural effusion, not elsewhere classified: Secondary | ICD-10-CM | POA: Diagnosis not present

## 2020-02-19 DIAGNOSIS — N3001 Acute cystitis with hematuria: Secondary | ICD-10-CM | POA: Diagnosis not present

## 2020-02-19 DIAGNOSIS — I447 Left bundle-branch block, unspecified: Secondary | ICD-10-CM | POA: Diagnosis not present

## 2020-02-19 DIAGNOSIS — C3481 Malignant neoplasm of overlapping sites of right bronchus and lung: Secondary | ICD-10-CM | POA: Diagnosis not present

## 2020-02-19 DIAGNOSIS — I119 Hypertensive heart disease without heart failure: Secondary | ICD-10-CM | POA: Diagnosis not present

## 2020-02-19 DIAGNOSIS — I2510 Atherosclerotic heart disease of native coronary artery without angina pectoris: Secondary | ICD-10-CM | POA: Diagnosis not present

## 2020-02-19 DIAGNOSIS — Z483 Aftercare following surgery for neoplasm: Secondary | ICD-10-CM | POA: Diagnosis not present

## 2020-02-20 DIAGNOSIS — I447 Left bundle-branch block, unspecified: Secondary | ICD-10-CM | POA: Diagnosis not present

## 2020-02-20 DIAGNOSIS — Z483 Aftercare following surgery for neoplasm: Secondary | ICD-10-CM | POA: Diagnosis not present

## 2020-02-20 DIAGNOSIS — E119 Type 2 diabetes mellitus without complications: Secondary | ICD-10-CM | POA: Diagnosis not present

## 2020-02-20 DIAGNOSIS — R251 Tremor, unspecified: Secondary | ICD-10-CM | POA: Diagnosis not present

## 2020-02-20 DIAGNOSIS — T83511A Infection and inflammatory reaction due to indwelling urethral catheter, initial encounter: Secondary | ICD-10-CM | POA: Diagnosis not present

## 2020-02-20 DIAGNOSIS — Z87891 Personal history of nicotine dependence: Secondary | ICD-10-CM | POA: Diagnosis not present

## 2020-02-20 DIAGNOSIS — I2510 Atherosclerotic heart disease of native coronary artery without angina pectoris: Secondary | ICD-10-CM | POA: Diagnosis not present

## 2020-02-20 DIAGNOSIS — I10 Essential (primary) hypertension: Secondary | ICD-10-CM | POA: Diagnosis not present

## 2020-02-20 DIAGNOSIS — D34 Benign neoplasm of thyroid gland: Secondary | ICD-10-CM | POA: Diagnosis not present

## 2020-02-20 DIAGNOSIS — N3001 Acute cystitis with hematuria: Secondary | ICD-10-CM | POA: Diagnosis not present

## 2020-02-20 DIAGNOSIS — C342 Malignant neoplasm of middle lobe, bronchus or lung: Secondary | ICD-10-CM | POA: Diagnosis not present

## 2020-02-20 DIAGNOSIS — J90 Pleural effusion, not elsewhere classified: Secondary | ICD-10-CM | POA: Diagnosis not present

## 2020-02-20 DIAGNOSIS — D497 Neoplasm of unspecified behavior of endocrine glands and other parts of nervous system: Secondary | ICD-10-CM | POA: Diagnosis not present

## 2020-02-20 DIAGNOSIS — Z7189 Other specified counseling: Secondary | ICD-10-CM | POA: Diagnosis not present

## 2020-02-20 DIAGNOSIS — K219 Gastro-esophageal reflux disease without esophagitis: Secondary | ICD-10-CM | POA: Diagnosis not present

## 2020-02-20 DIAGNOSIS — Z95828 Presence of other vascular implants and grafts: Secondary | ICD-10-CM | POA: Diagnosis not present

## 2020-02-20 DIAGNOSIS — C3481 Malignant neoplasm of overlapping sites of right bronchus and lung: Secondary | ICD-10-CM | POA: Diagnosis not present

## 2020-02-20 DIAGNOSIS — F419 Anxiety disorder, unspecified: Secondary | ICD-10-CM | POA: Diagnosis not present

## 2020-02-20 DIAGNOSIS — I119 Hypertensive heart disease without heart failure: Secondary | ICD-10-CM | POA: Diagnosis not present

## 2020-02-20 DIAGNOSIS — G969 Disorder of central nervous system, unspecified: Secondary | ICD-10-CM | POA: Diagnosis not present

## 2020-02-21 DIAGNOSIS — N390 Urinary tract infection, site not specified: Secondary | ICD-10-CM | POA: Diagnosis not present

## 2020-02-21 DIAGNOSIS — I447 Left bundle-branch block, unspecified: Secondary | ICD-10-CM | POA: Diagnosis not present

## 2020-02-21 DIAGNOSIS — I119 Hypertensive heart disease without heart failure: Secondary | ICD-10-CM | POA: Diagnosis not present

## 2020-02-21 DIAGNOSIS — I2510 Atherosclerotic heart disease of native coronary artery without angina pectoris: Secondary | ICD-10-CM | POA: Diagnosis not present

## 2020-02-21 DIAGNOSIS — N3001 Acute cystitis with hematuria: Secondary | ICD-10-CM | POA: Diagnosis not present

## 2020-02-21 DIAGNOSIS — D497 Neoplasm of unspecified behavior of endocrine glands and other parts of nervous system: Secondary | ICD-10-CM | POA: Diagnosis not present

## 2020-02-21 DIAGNOSIS — J90 Pleural effusion, not elsewhere classified: Secondary | ICD-10-CM | POA: Diagnosis not present

## 2020-02-21 DIAGNOSIS — Z483 Aftercare following surgery for neoplasm: Secondary | ICD-10-CM | POA: Diagnosis not present

## 2020-02-21 DIAGNOSIS — C3491 Malignant neoplasm of unspecified part of right bronchus or lung: Secondary | ICD-10-CM | POA: Diagnosis not present

## 2020-02-21 DIAGNOSIS — C3481 Malignant neoplasm of overlapping sites of right bronchus and lung: Secondary | ICD-10-CM | POA: Diagnosis not present

## 2020-02-21 DIAGNOSIS — T83511A Infection and inflammatory reaction due to indwelling urethral catheter, initial encounter: Secondary | ICD-10-CM | POA: Diagnosis not present

## 2020-02-27 DIAGNOSIS — C3481 Malignant neoplasm of overlapping sites of right bronchus and lung: Secondary | ICD-10-CM | POA: Diagnosis not present

## 2020-02-27 DIAGNOSIS — E041 Nontoxic single thyroid nodule: Secondary | ICD-10-CM | POA: Diagnosis not present

## 2020-03-06 DIAGNOSIS — C342 Malignant neoplasm of middle lobe, bronchus or lung: Secondary | ICD-10-CM | POA: Diagnosis not present

## 2020-03-06 DIAGNOSIS — Z95828 Presence of other vascular implants and grafts: Secondary | ICD-10-CM | POA: Diagnosis not present

## 2020-03-11 DIAGNOSIS — C771 Secondary and unspecified malignant neoplasm of intrathoracic lymph nodes: Secondary | ICD-10-CM | POA: Diagnosis not present

## 2020-03-11 DIAGNOSIS — C3481 Malignant neoplasm of overlapping sites of right bronchus and lung: Secondary | ICD-10-CM | POA: Diagnosis not present

## 2020-03-11 DIAGNOSIS — K219 Gastro-esophageal reflux disease without esophagitis: Secondary | ICD-10-CM | POA: Diagnosis not present

## 2020-03-11 DIAGNOSIS — D329 Benign neoplasm of meninges, unspecified: Secondary | ICD-10-CM | POA: Diagnosis not present

## 2020-03-11 DIAGNOSIS — E119 Type 2 diabetes mellitus without complications: Secondary | ICD-10-CM | POA: Diagnosis not present

## 2020-03-11 DIAGNOSIS — I10 Essential (primary) hypertension: Secondary | ICD-10-CM | POA: Diagnosis not present

## 2020-03-11 DIAGNOSIS — R202 Paresthesia of skin: Secondary | ICD-10-CM | POA: Diagnosis not present

## 2020-03-11 DIAGNOSIS — K222 Esophageal obstruction: Secondary | ICD-10-CM | POA: Diagnosis not present

## 2020-03-11 DIAGNOSIS — R251 Tremor, unspecified: Secondary | ICD-10-CM | POA: Diagnosis not present

## 2020-03-11 DIAGNOSIS — G969 Disorder of central nervous system, unspecified: Secondary | ICD-10-CM | POA: Diagnosis not present

## 2020-03-11 DIAGNOSIS — D34 Benign neoplasm of thyroid gland: Secondary | ICD-10-CM | POA: Diagnosis not present

## 2020-03-11 DIAGNOSIS — C342 Malignant neoplasm of middle lobe, bronchus or lung: Secondary | ICD-10-CM | POA: Diagnosis not present

## 2020-03-11 DIAGNOSIS — Z7189 Other specified counseling: Secondary | ICD-10-CM | POA: Diagnosis not present

## 2020-03-11 DIAGNOSIS — I452 Bifascicular block: Secondary | ICD-10-CM | POA: Diagnosis not present

## 2020-03-12 DIAGNOSIS — C342 Malignant neoplasm of middle lobe, bronchus or lung: Secondary | ICD-10-CM | POA: Diagnosis not present

## 2020-03-12 DIAGNOSIS — Z5111 Encounter for antineoplastic chemotherapy: Secondary | ICD-10-CM | POA: Diagnosis not present

## 2020-03-13 DIAGNOSIS — C342 Malignant neoplasm of middle lobe, bronchus or lung: Secondary | ICD-10-CM | POA: Diagnosis not present

## 2020-03-19 DIAGNOSIS — C342 Malignant neoplasm of middle lobe, bronchus or lung: Secondary | ICD-10-CM | POA: Diagnosis not present

## 2020-03-19 DIAGNOSIS — Z95828 Presence of other vascular implants and grafts: Secondary | ICD-10-CM | POA: Diagnosis not present

## 2020-03-19 DIAGNOSIS — T451X5A Adverse effect of antineoplastic and immunosuppressive drugs, initial encounter: Secondary | ICD-10-CM | POA: Diagnosis not present

## 2020-03-19 DIAGNOSIS — R110 Nausea: Secondary | ICD-10-CM | POA: Diagnosis not present

## 2020-03-19 DIAGNOSIS — Z09 Encounter for follow-up examination after completed treatment for conditions other than malignant neoplasm: Secondary | ICD-10-CM | POA: Diagnosis not present

## 2020-03-25 DIAGNOSIS — I10 Essential (primary) hypertension: Secondary | ICD-10-CM | POA: Diagnosis not present

## 2020-03-25 DIAGNOSIS — F419 Anxiety disorder, unspecified: Secondary | ICD-10-CM | POA: Diagnosis not present

## 2020-03-25 DIAGNOSIS — C3491 Malignant neoplasm of unspecified part of right bronchus or lung: Secondary | ICD-10-CM | POA: Diagnosis not present

## 2020-04-02 DIAGNOSIS — Z7189 Other specified counseling: Secondary | ICD-10-CM | POA: Diagnosis not present

## 2020-04-02 DIAGNOSIS — G969 Disorder of central nervous system, unspecified: Secondary | ICD-10-CM | POA: Diagnosis not present

## 2020-04-02 DIAGNOSIS — Z09 Encounter for follow-up examination after completed treatment for conditions other than malignant neoplasm: Secondary | ICD-10-CM | POA: Diagnosis not present

## 2020-04-02 DIAGNOSIS — D34 Benign neoplasm of thyroid gland: Secondary | ICD-10-CM | POA: Diagnosis not present

## 2020-04-02 DIAGNOSIS — R251 Tremor, unspecified: Secondary | ICD-10-CM | POA: Diagnosis not present

## 2020-04-02 DIAGNOSIS — D329 Benign neoplasm of meninges, unspecified: Secondary | ICD-10-CM | POA: Diagnosis not present

## 2020-04-02 DIAGNOSIS — R110 Nausea: Secondary | ICD-10-CM | POA: Diagnosis not present

## 2020-04-02 DIAGNOSIS — T451X5A Adverse effect of antineoplastic and immunosuppressive drugs, initial encounter: Secondary | ICD-10-CM | POA: Diagnosis not present

## 2020-04-02 DIAGNOSIS — C342 Malignant neoplasm of middle lobe, bronchus or lung: Secondary | ICD-10-CM | POA: Diagnosis not present

## 2020-04-03 DIAGNOSIS — Z95828 Presence of other vascular implants and grafts: Secondary | ICD-10-CM | POA: Diagnosis not present

## 2020-04-03 DIAGNOSIS — C342 Malignant neoplasm of middle lobe, bronchus or lung: Secondary | ICD-10-CM | POA: Diagnosis not present

## 2020-04-03 DIAGNOSIS — Z5111 Encounter for antineoplastic chemotherapy: Secondary | ICD-10-CM | POA: Diagnosis not present

## 2020-04-04 DIAGNOSIS — C342 Malignant neoplasm of middle lobe, bronchus or lung: Secondary | ICD-10-CM | POA: Diagnosis not present

## 2020-04-07 DIAGNOSIS — C342 Malignant neoplasm of middle lobe, bronchus or lung: Secondary | ICD-10-CM | POA: Diagnosis not present

## 2020-04-07 DIAGNOSIS — E860 Dehydration: Secondary | ICD-10-CM | POA: Diagnosis not present

## 2020-04-07 DIAGNOSIS — Z95828 Presence of other vascular implants and grafts: Secondary | ICD-10-CM | POA: Diagnosis not present

## 2020-04-07 DIAGNOSIS — R112 Nausea with vomiting, unspecified: Secondary | ICD-10-CM | POA: Diagnosis not present

## 2020-04-22 DIAGNOSIS — D34 Benign neoplasm of thyroid gland: Secondary | ICD-10-CM | POA: Diagnosis not present

## 2020-04-22 DIAGNOSIS — T451X5A Adverse effect of antineoplastic and immunosuppressive drugs, initial encounter: Secondary | ICD-10-CM | POA: Diagnosis not present

## 2020-04-22 DIAGNOSIS — C342 Malignant neoplasm of middle lobe, bronchus or lung: Secondary | ICD-10-CM | POA: Diagnosis not present

## 2020-04-22 DIAGNOSIS — Z09 Encounter for follow-up examination after completed treatment for conditions other than malignant neoplasm: Secondary | ICD-10-CM | POA: Diagnosis not present

## 2020-04-22 DIAGNOSIS — R110 Nausea: Secondary | ICD-10-CM | POA: Diagnosis not present

## 2020-04-24 DIAGNOSIS — Z5111 Encounter for antineoplastic chemotherapy: Secondary | ICD-10-CM | POA: Diagnosis not present

## 2020-04-24 DIAGNOSIS — C342 Malignant neoplasm of middle lobe, bronchus or lung: Secondary | ICD-10-CM | POA: Diagnosis not present

## 2020-04-25 DIAGNOSIS — C342 Malignant neoplasm of middle lobe, bronchus or lung: Secondary | ICD-10-CM | POA: Diagnosis not present

## 2020-04-28 DIAGNOSIS — T83511A Infection and inflammatory reaction due to indwelling urethral catheter, initial encounter: Secondary | ICD-10-CM | POA: Diagnosis not present

## 2020-04-28 DIAGNOSIS — Z95828 Presence of other vascular implants and grafts: Secondary | ICD-10-CM | POA: Diagnosis not present

## 2020-04-28 DIAGNOSIS — N390 Urinary tract infection, site not specified: Secondary | ICD-10-CM | POA: Diagnosis not present

## 2020-04-28 DIAGNOSIS — T451X5A Adverse effect of antineoplastic and immunosuppressive drugs, initial encounter: Secondary | ICD-10-CM | POA: Diagnosis not present

## 2020-04-28 DIAGNOSIS — R110 Nausea: Secondary | ICD-10-CM | POA: Diagnosis not present

## 2020-04-28 DIAGNOSIS — C342 Malignant neoplasm of middle lobe, bronchus or lung: Secondary | ICD-10-CM | POA: Diagnosis not present

## 2020-04-30 DIAGNOSIS — Z95828 Presence of other vascular implants and grafts: Secondary | ICD-10-CM | POA: Diagnosis not present

## 2020-04-30 DIAGNOSIS — C342 Malignant neoplasm of middle lobe, bronchus or lung: Secondary | ICD-10-CM | POA: Diagnosis not present

## 2020-05-12 DIAGNOSIS — E7849 Other hyperlipidemia: Secondary | ICD-10-CM | POA: Diagnosis not present

## 2020-05-12 DIAGNOSIS — K219 Gastro-esophageal reflux disease without esophagitis: Secondary | ICD-10-CM | POA: Diagnosis not present

## 2020-05-12 DIAGNOSIS — R739 Hyperglycemia, unspecified: Secondary | ICD-10-CM | POA: Diagnosis not present

## 2020-05-12 DIAGNOSIS — I10 Essential (primary) hypertension: Secondary | ICD-10-CM | POA: Diagnosis not present

## 2020-05-13 DIAGNOSIS — R251 Tremor, unspecified: Secondary | ICD-10-CM | POA: Diagnosis not present

## 2020-05-13 DIAGNOSIS — C342 Malignant neoplasm of middle lobe, bronchus or lung: Secondary | ICD-10-CM | POA: Diagnosis not present

## 2020-05-13 DIAGNOSIS — Z7189 Other specified counseling: Secondary | ICD-10-CM | POA: Diagnosis not present

## 2020-05-13 DIAGNOSIS — G969 Disorder of central nervous system, unspecified: Secondary | ICD-10-CM | POA: Diagnosis not present

## 2020-05-13 DIAGNOSIS — Z9889 Other specified postprocedural states: Secondary | ICD-10-CM | POA: Diagnosis not present

## 2020-05-13 DIAGNOSIS — Z09 Encounter for follow-up examination after completed treatment for conditions other than malignant neoplasm: Secondary | ICD-10-CM | POA: Diagnosis not present

## 2020-05-15 DIAGNOSIS — Z95828 Presence of other vascular implants and grafts: Secondary | ICD-10-CM | POA: Diagnosis not present

## 2020-05-15 DIAGNOSIS — C342 Malignant neoplasm of middle lobe, bronchus or lung: Secondary | ICD-10-CM | POA: Diagnosis not present

## 2020-05-15 DIAGNOSIS — Z5111 Encounter for antineoplastic chemotherapy: Secondary | ICD-10-CM | POA: Diagnosis not present

## 2020-05-16 DIAGNOSIS — C342 Malignant neoplasm of middle lobe, bronchus or lung: Secondary | ICD-10-CM | POA: Diagnosis not present

## 2020-05-16 DIAGNOSIS — Z95828 Presence of other vascular implants and grafts: Secondary | ICD-10-CM | POA: Diagnosis not present

## 2020-05-20 DIAGNOSIS — Z452 Encounter for adjustment and management of vascular access device: Secondary | ICD-10-CM | POA: Diagnosis not present

## 2020-05-20 DIAGNOSIS — C342 Malignant neoplasm of middle lobe, bronchus or lung: Secondary | ICD-10-CM | POA: Diagnosis not present

## 2020-06-10 DIAGNOSIS — K219 Gastro-esophageal reflux disease without esophagitis: Secondary | ICD-10-CM | POA: Diagnosis not present

## 2020-06-10 DIAGNOSIS — E7849 Other hyperlipidemia: Secondary | ICD-10-CM | POA: Diagnosis not present

## 2020-06-10 DIAGNOSIS — Z1329 Encounter for screening for other suspected endocrine disorder: Secondary | ICD-10-CM | POA: Diagnosis not present

## 2020-06-10 DIAGNOSIS — Z13220 Encounter for screening for lipoid disorders: Secondary | ICD-10-CM | POA: Diagnosis not present

## 2020-06-10 DIAGNOSIS — I10 Essential (primary) hypertension: Secondary | ICD-10-CM | POA: Diagnosis not present

## 2020-06-10 DIAGNOSIS — R739 Hyperglycemia, unspecified: Secondary | ICD-10-CM | POA: Diagnosis not present

## 2020-06-10 DIAGNOSIS — E782 Mixed hyperlipidemia: Secondary | ICD-10-CM | POA: Diagnosis not present

## 2020-06-11 DIAGNOSIS — E7849 Other hyperlipidemia: Secondary | ICD-10-CM | POA: Diagnosis not present

## 2020-06-11 DIAGNOSIS — I10 Essential (primary) hypertension: Secondary | ICD-10-CM | POA: Diagnosis not present

## 2020-06-11 DIAGNOSIS — R739 Hyperglycemia, unspecified: Secondary | ICD-10-CM | POA: Diagnosis not present

## 2020-06-11 DIAGNOSIS — K219 Gastro-esophageal reflux disease without esophagitis: Secondary | ICD-10-CM | POA: Diagnosis not present

## 2020-06-17 DIAGNOSIS — Z6827 Body mass index (BMI) 27.0-27.9, adult: Secondary | ICD-10-CM | POA: Diagnosis not present

## 2020-06-17 DIAGNOSIS — M8580 Other specified disorders of bone density and structure, unspecified site: Secondary | ICD-10-CM | POA: Diagnosis not present

## 2020-06-17 DIAGNOSIS — E039 Hypothyroidism, unspecified: Secondary | ICD-10-CM | POA: Diagnosis not present

## 2020-06-17 DIAGNOSIS — E7849 Other hyperlipidemia: Secondary | ICD-10-CM | POA: Diagnosis not present

## 2020-06-17 DIAGNOSIS — I10 Essential (primary) hypertension: Secondary | ICD-10-CM | POA: Diagnosis not present

## 2020-06-17 DIAGNOSIS — F419 Anxiety disorder, unspecified: Secondary | ICD-10-CM | POA: Diagnosis not present

## 2020-07-09 DIAGNOSIS — C3490 Malignant neoplasm of unspecified part of unspecified bronchus or lung: Secondary | ICD-10-CM | POA: Diagnosis not present

## 2020-07-09 DIAGNOSIS — J90 Pleural effusion, not elsewhere classified: Secondary | ICD-10-CM | POA: Diagnosis not present

## 2020-07-09 DIAGNOSIS — I2510 Atherosclerotic heart disease of native coronary artery without angina pectoris: Secondary | ICD-10-CM | POA: Diagnosis not present

## 2020-07-09 DIAGNOSIS — R918 Other nonspecific abnormal finding of lung field: Secondary | ICD-10-CM | POA: Diagnosis not present

## 2020-07-09 DIAGNOSIS — C342 Malignant neoplasm of middle lobe, bronchus or lung: Secondary | ICD-10-CM | POA: Diagnosis not present

## 2020-07-09 DIAGNOSIS — Z1329 Encounter for screening for other suspected endocrine disorder: Secondary | ICD-10-CM | POA: Diagnosis not present

## 2020-07-09 DIAGNOSIS — I700 Atherosclerosis of aorta: Secondary | ICD-10-CM | POA: Diagnosis not present

## 2020-07-09 DIAGNOSIS — Z902 Acquired absence of lung [part of]: Secondary | ICD-10-CM | POA: Diagnosis not present

## 2020-07-09 DIAGNOSIS — K7689 Other specified diseases of liver: Secondary | ICD-10-CM | POA: Diagnosis not present

## 2020-07-11 DIAGNOSIS — Z7189 Other specified counseling: Secondary | ICD-10-CM | POA: Diagnosis not present

## 2020-07-11 DIAGNOSIS — Z9221 Personal history of antineoplastic chemotherapy: Secondary | ICD-10-CM | POA: Diagnosis not present

## 2020-07-11 DIAGNOSIS — C342 Malignant neoplasm of middle lobe, bronchus or lung: Secondary | ICD-10-CM | POA: Diagnosis not present

## 2020-07-11 DIAGNOSIS — Z9889 Other specified postprocedural states: Secondary | ICD-10-CM | POA: Diagnosis not present

## 2020-07-11 DIAGNOSIS — Z23 Encounter for immunization: Secondary | ICD-10-CM | POA: Diagnosis not present

## 2020-07-12 DIAGNOSIS — R739 Hyperglycemia, unspecified: Secondary | ICD-10-CM | POA: Diagnosis not present

## 2020-07-12 DIAGNOSIS — I10 Essential (primary) hypertension: Secondary | ICD-10-CM | POA: Diagnosis not present

## 2020-07-12 DIAGNOSIS — E7849 Other hyperlipidemia: Secondary | ICD-10-CM | POA: Diagnosis not present

## 2020-07-12 DIAGNOSIS — K219 Gastro-esophageal reflux disease without esophagitis: Secondary | ICD-10-CM | POA: Diagnosis not present

## 2020-07-30 DIAGNOSIS — S70361A Insect bite (nonvenomous), right thigh, initial encounter: Secondary | ICD-10-CM | POA: Diagnosis not present

## 2020-07-30 DIAGNOSIS — Z6827 Body mass index (BMI) 27.0-27.9, adult: Secondary | ICD-10-CM | POA: Diagnosis not present

## 2020-08-11 DIAGNOSIS — I10 Essential (primary) hypertension: Secondary | ICD-10-CM | POA: Diagnosis not present

## 2020-08-11 DIAGNOSIS — K219 Gastro-esophageal reflux disease without esophagitis: Secondary | ICD-10-CM | POA: Diagnosis not present

## 2020-08-11 DIAGNOSIS — R739 Hyperglycemia, unspecified: Secondary | ICD-10-CM | POA: Diagnosis not present

## 2020-08-11 DIAGNOSIS — E7849 Other hyperlipidemia: Secondary | ICD-10-CM | POA: Diagnosis not present

## 2020-08-18 DIAGNOSIS — Z23 Encounter for immunization: Secondary | ICD-10-CM | POA: Diagnosis not present

## 2020-08-18 DIAGNOSIS — I10 Essential (primary) hypertension: Secondary | ICD-10-CM | POA: Diagnosis not present

## 2020-08-18 DIAGNOSIS — E039 Hypothyroidism, unspecified: Secondary | ICD-10-CM | POA: Diagnosis not present

## 2020-08-18 DIAGNOSIS — Z6827 Body mass index (BMI) 27.0-27.9, adult: Secondary | ICD-10-CM | POA: Diagnosis not present

## 2020-09-10 DIAGNOSIS — Z95828 Presence of other vascular implants and grafts: Secondary | ICD-10-CM | POA: Diagnosis not present

## 2020-10-12 DIAGNOSIS — I10 Essential (primary) hypertension: Secondary | ICD-10-CM | POA: Diagnosis not present

## 2020-10-12 DIAGNOSIS — K219 Gastro-esophageal reflux disease without esophagitis: Secondary | ICD-10-CM | POA: Diagnosis not present

## 2020-10-12 DIAGNOSIS — E7849 Other hyperlipidemia: Secondary | ICD-10-CM | POA: Diagnosis not present

## 2020-10-12 DIAGNOSIS — R739 Hyperglycemia, unspecified: Secondary | ICD-10-CM | POA: Diagnosis not present

## 2020-11-06 DIAGNOSIS — C342 Malignant neoplasm of middle lobe, bronchus or lung: Secondary | ICD-10-CM | POA: Diagnosis not present

## 2020-11-06 DIAGNOSIS — Z09 Encounter for follow-up examination after completed treatment for conditions other than malignant neoplasm: Secondary | ICD-10-CM | POA: Diagnosis not present

## 2020-11-10 DIAGNOSIS — Z9889 Other specified postprocedural states: Secondary | ICD-10-CM | POA: Diagnosis not present

## 2020-11-10 DIAGNOSIS — Z902 Acquired absence of lung [part of]: Secondary | ICD-10-CM | POA: Diagnosis not present

## 2020-11-10 DIAGNOSIS — C3490 Malignant neoplasm of unspecified part of unspecified bronchus or lung: Secondary | ICD-10-CM | POA: Diagnosis not present

## 2020-11-10 DIAGNOSIS — R918 Other nonspecific abnormal finding of lung field: Secondary | ICD-10-CM | POA: Diagnosis not present

## 2020-11-10 DIAGNOSIS — I700 Atherosclerosis of aorta: Secondary | ICD-10-CM | POA: Diagnosis not present

## 2020-11-10 DIAGNOSIS — I2510 Atherosclerotic heart disease of native coronary artery without angina pectoris: Secondary | ICD-10-CM | POA: Diagnosis not present

## 2020-11-10 DIAGNOSIS — C342 Malignant neoplasm of middle lobe, bronchus or lung: Secondary | ICD-10-CM | POA: Diagnosis not present

## 2020-11-13 DIAGNOSIS — K219 Gastro-esophageal reflux disease without esophagitis: Secondary | ICD-10-CM | POA: Diagnosis not present

## 2020-11-13 DIAGNOSIS — I452 Bifascicular block: Secondary | ICD-10-CM | POA: Diagnosis not present

## 2020-11-13 DIAGNOSIS — E119 Type 2 diabetes mellitus without complications: Secondary | ICD-10-CM | POA: Diagnosis not present

## 2020-11-13 DIAGNOSIS — C50811 Malignant neoplasm of overlapping sites of right female breast: Secondary | ICD-10-CM | POA: Diagnosis not present

## 2020-11-13 DIAGNOSIS — I10 Essential (primary) hypertension: Secondary | ICD-10-CM | POA: Diagnosis not present

## 2020-11-13 DIAGNOSIS — Z9221 Personal history of antineoplastic chemotherapy: Secondary | ICD-10-CM | POA: Diagnosis not present

## 2020-11-13 DIAGNOSIS — R42 Dizziness and giddiness: Secondary | ICD-10-CM | POA: Diagnosis not present

## 2020-11-13 DIAGNOSIS — M8580 Other specified disorders of bone density and structure, unspecified site: Secondary | ICD-10-CM | POA: Diagnosis not present

## 2020-11-13 DIAGNOSIS — D329 Benign neoplasm of meninges, unspecified: Secondary | ICD-10-CM | POA: Diagnosis not present

## 2020-11-13 DIAGNOSIS — C342 Malignant neoplasm of middle lobe, bronchus or lung: Secondary | ICD-10-CM | POA: Diagnosis not present

## 2020-11-13 DIAGNOSIS — M791 Myalgia, unspecified site: Secondary | ICD-10-CM | POA: Diagnosis not present

## 2020-11-28 DIAGNOSIS — R55 Syncope and collapse: Secondary | ICD-10-CM | POA: Diagnosis not present

## 2020-11-28 DIAGNOSIS — G250 Essential tremor: Secondary | ICD-10-CM | POA: Diagnosis not present

## 2020-11-28 DIAGNOSIS — Z6826 Body mass index (BMI) 26.0-26.9, adult: Secondary | ICD-10-CM | POA: Diagnosis not present

## 2020-11-28 DIAGNOSIS — C3491 Malignant neoplasm of unspecified part of right bronchus or lung: Secondary | ICD-10-CM | POA: Diagnosis not present

## 2020-11-28 DIAGNOSIS — E559 Vitamin D deficiency, unspecified: Secondary | ICD-10-CM | POA: Diagnosis not present

## 2020-11-28 DIAGNOSIS — E039 Hypothyroidism, unspecified: Secondary | ICD-10-CM | POA: Diagnosis not present

## 2020-12-15 DIAGNOSIS — I771 Stricture of artery: Secondary | ICD-10-CM | POA: Diagnosis not present

## 2020-12-15 DIAGNOSIS — R55 Syncope and collapse: Secondary | ICD-10-CM | POA: Diagnosis not present

## 2020-12-15 DIAGNOSIS — I6523 Occlusion and stenosis of bilateral carotid arteries: Secondary | ICD-10-CM | POA: Diagnosis not present

## 2021-01-20 DIAGNOSIS — Z95828 Presence of other vascular implants and grafts: Secondary | ICD-10-CM | POA: Diagnosis not present

## 2021-01-27 DIAGNOSIS — Z23 Encounter for immunization: Secondary | ICD-10-CM | POA: Diagnosis not present

## 2021-02-17 DIAGNOSIS — I10 Essential (primary) hypertension: Secondary | ICD-10-CM | POA: Diagnosis not present

## 2021-02-17 DIAGNOSIS — R350 Frequency of micturition: Secondary | ICD-10-CM | POA: Diagnosis not present

## 2021-02-17 DIAGNOSIS — E039 Hypothyroidism, unspecified: Secondary | ICD-10-CM | POA: Diagnosis not present

## 2021-02-17 DIAGNOSIS — Z23 Encounter for immunization: Secondary | ICD-10-CM | POA: Diagnosis not present

## 2021-02-17 DIAGNOSIS — Z6827 Body mass index (BMI) 27.0-27.9, adult: Secondary | ICD-10-CM | POA: Diagnosis not present

## 2021-02-18 ENCOUNTER — Encounter (INDEPENDENT_AMBULATORY_CARE_PROVIDER_SITE_OTHER): Payer: Self-pay | Admitting: *Deleted

## 2021-02-23 DIAGNOSIS — Z1283 Encounter for screening for malignant neoplasm of skin: Secondary | ICD-10-CM | POA: Diagnosis not present

## 2021-02-23 DIAGNOSIS — L570 Actinic keratosis: Secondary | ICD-10-CM | POA: Diagnosis not present

## 2021-02-23 DIAGNOSIS — L814 Other melanin hyperpigmentation: Secondary | ICD-10-CM | POA: Diagnosis not present

## 2021-02-23 DIAGNOSIS — L280 Lichen simplex chronicus: Secondary | ICD-10-CM | POA: Diagnosis not present

## 2021-03-09 DIAGNOSIS — Z20828 Contact with and (suspected) exposure to other viral communicable diseases: Secondary | ICD-10-CM | POA: Diagnosis not present

## 2021-03-09 DIAGNOSIS — J069 Acute upper respiratory infection, unspecified: Secondary | ICD-10-CM | POA: Diagnosis not present

## 2021-03-13 DIAGNOSIS — R739 Hyperglycemia, unspecified: Secondary | ICD-10-CM | POA: Diagnosis not present

## 2021-03-13 DIAGNOSIS — E7849 Other hyperlipidemia: Secondary | ICD-10-CM | POA: Diagnosis not present

## 2021-03-13 DIAGNOSIS — I10 Essential (primary) hypertension: Secondary | ICD-10-CM | POA: Diagnosis not present

## 2021-03-13 DIAGNOSIS — K219 Gastro-esophageal reflux disease without esophagitis: Secondary | ICD-10-CM | POA: Diagnosis not present

## 2021-04-17 ENCOUNTER — Encounter (INDEPENDENT_AMBULATORY_CARE_PROVIDER_SITE_OTHER): Payer: Self-pay | Admitting: *Deleted

## 2021-04-20 ENCOUNTER — Telehealth (INDEPENDENT_AMBULATORY_CARE_PROVIDER_SITE_OTHER): Payer: Self-pay | Admitting: *Deleted

## 2021-04-20 ENCOUNTER — Encounter (INDEPENDENT_AMBULATORY_CARE_PROVIDER_SITE_OTHER): Payer: Self-pay | Admitting: *Deleted

## 2021-04-20 ENCOUNTER — Other Ambulatory Visit (INDEPENDENT_AMBULATORY_CARE_PROVIDER_SITE_OTHER): Payer: Self-pay

## 2021-04-20 DIAGNOSIS — Z1211 Encounter for screening for malignant neoplasm of colon: Secondary | ICD-10-CM

## 2021-04-20 MED ORDER — PEG 3350-KCL-NA BICARB-NACL 420 G PO SOLR: 4000.0000 mL | Freq: Once | ORAL | 0 refills | Status: AC

## 2021-04-20 NOTE — Telephone Encounter (Signed)
Patient needs trilyte 

## 2021-04-30 ENCOUNTER — Telehealth (INDEPENDENT_AMBULATORY_CARE_PROVIDER_SITE_OTHER): Payer: Self-pay | Admitting: *Deleted

## 2021-04-30 NOTE — Telephone Encounter (Signed)
Referring MD/PCP: [place]  Procedure: tcs  Reason/Indication:  screening  Has patient had this procedure before?  Yes, 2012  If so, when, by whom and where?    Is there a family history of colon cancer?  no  Who?  What age when diagnosed?    Is patient diabetic? If yes, Type 1 or Type 2   no      Does patient have prosthetic heart valve or mechanical valve?  no  Do you have a pacemaker/defibrillator?  no  Has patient ever had endocarditis/atrial fibrillation? no  Does patient use oxygen? no  Has patient had joint replacement within last 12 months?  no  Is patient constipated or do they take laxatives? no  Does patient have a history of alcohol/drug use?  no  Have you had a stroke/heart attack last 6 mths? no  Do you take medicine for weight loss?  no  For female patients,: have you had a hysterectomy                       are you post menopausal                       do you still have your menstrual cycle no  Is patient on blood thinner such as Coumadin, Plavix and/or Aspirin? no  Medications: fluticasone nasal spray, levocetirizine 5 mg daily, lisinopril/hctz 20/12.5 mg daily, omeprazole 20 mg daily, pravastatin 20 mg daily, venlafaxine 150 mg daily, acetaminophen prn, calcium daily, levothyroxine 75 mcg daily  Allergies: nkda  Medication Adjustment per Dr Rehman/Dr Jenetta Downer   Procedure date & time: 05/27/21

## 2021-05-11 DIAGNOSIS — Z1231 Encounter for screening mammogram for malignant neoplasm of breast: Secondary | ICD-10-CM | POA: Diagnosis not present

## 2021-05-13 DIAGNOSIS — Z09 Encounter for follow-up examination after completed treatment for conditions other than malignant neoplasm: Secondary | ICD-10-CM | POA: Diagnosis not present

## 2021-05-13 DIAGNOSIS — C3490 Malignant neoplasm of unspecified part of unspecified bronchus or lung: Secondary | ICD-10-CM | POA: Diagnosis not present

## 2021-05-13 DIAGNOSIS — R918 Other nonspecific abnormal finding of lung field: Secondary | ICD-10-CM | POA: Diagnosis not present

## 2021-05-13 DIAGNOSIS — C342 Malignant neoplasm of middle lobe, bronchus or lung: Secondary | ICD-10-CM | POA: Diagnosis not present

## 2021-05-13 DIAGNOSIS — Z85118 Personal history of other malignant neoplasm of bronchus and lung: Secondary | ICD-10-CM | POA: Diagnosis not present

## 2021-05-13 DIAGNOSIS — Z902 Acquired absence of lung [part of]: Secondary | ICD-10-CM | POA: Diagnosis not present

## 2021-05-13 DIAGNOSIS — I700 Atherosclerosis of aorta: Secondary | ICD-10-CM | POA: Diagnosis not present

## 2021-05-13 DIAGNOSIS — I2510 Atherosclerotic heart disease of native coronary artery without angina pectoris: Secondary | ICD-10-CM | POA: Diagnosis not present

## 2021-05-13 DIAGNOSIS — Z9221 Personal history of antineoplastic chemotherapy: Secondary | ICD-10-CM | POA: Diagnosis not present

## 2021-05-13 DIAGNOSIS — Z08 Encounter for follow-up examination after completed treatment for malignant neoplasm: Secondary | ICD-10-CM | POA: Diagnosis not present

## 2021-05-15 DIAGNOSIS — C342 Malignant neoplasm of middle lobe, bronchus or lung: Secondary | ICD-10-CM | POA: Diagnosis not present

## 2021-05-20 NOTE — Patient Instructions (Signed)
? ? ? ? ? ? Katie Wagner ? 05/20/2021  ?  ? @PREFPERIOPPHARMACY @ ? ? Your procedure is scheduled on  05/27/2021. ? ? Report to [facility] at  [place].M. ? ? Call this number if you have problems the morning of surgery: ? [phone] ? ? Remember: ? Follow the diet and prep instructions given to you by the office. ?  ? Take these medicines the morning of surgery with A SIP OF WATER  ? ?       xyzal, synthroid, prilosec, propranolol, effexor. ?  ? ? Do not wear jewelry, make-up or nail polish. ? Do not wear lotions, powders, or perfumes, or deodorant. ? Do not shave 48 hours prior to surgery.  Men may shave face and neck. ? Do not bring valuables to the hospital. ? [facility] is not responsible for any belongings or valuables. ? ?Contacts, dentures or bridgework may not be worn into surgery.  Leave your suitcase in the car.  After surgery it may be brought to your room. ? ?For patients admitted to the hospital, discharge time will be determined by your treatment team. ? ?Patients discharged the day of surgery will not be allowed to drive home and must have someone with them for 24 hours.  ? ? ?Special instructions:   DO NOT smoke tobacco or vape for 24 hours before your procedure. ? ?Please read over the following fact sheets that you were given. ?Anesthesia Post-op Instructions and Care and Recovery After Surgery ?  ? ? ? Colonoscopy, Adult, Care After ?This sheet gives you information about how to care for yourself after your procedure. Your health care provider may also give you more specific instructions. If you have problems or questions, contact your health care provider. ?What can I expect after the procedure? ?After the procedure, it is common to have: ?A small amount of blood in your stool for 24 hours after the procedure. ?Some gas. ?Mild cramping or bloating of your abdomen. ?Follow these instructions at home: ?Eating and drinking ? ?Drink enough fluid to keep your urine pale yellow. ?Follow  instructions from your health care provider about eating or drinking restrictions. ?Resume your normal diet as instructed by your health care provider. Avoid heavy or fried foods that are hard to digest. ?Activity ?Rest as told by your health care provider. ?Avoid sitting for a long time without moving. Get up to take short walks every 1-2 hours. This is important to improve blood flow and breathing. Ask for help if you feel weak or unsteady. ?Return to your normal activities as told by your health care provider. Ask your health care provider what activities are safe for you. ?Managing cramping and bloating ? ?Try walking around when you have cramps or feel bloated. ?Apply heat to your abdomen as told by your health care provider. Use the heat source that your health care provider recommends, such as a moist heat pack or a heating pad. ?Place a towel between your skin and the heat source. ?Leave the heat on for 20-30 minutes. ?Remove the heat if your skin turns bright red. This is especially important if you are unable to feel pain, heat, or cold. You may have a greater risk of getting burned. ?General instructions ?If you were given a sedative during the procedure, it can affect you for several hours. Do not drive or operate machinery until your health care provider says that it is safe. ?For the first 24 hours after the procedure: ?  Do not sign important documents. ?Do not drink alcohol. ?Do your regular daily activities at a slower pace than normal. ?Eat soft foods that are easy to digest. ?Take over-the-counter and prescription medicines only as told by your health care provider. ?Keep all follow-up visits as told by your health care provider. This is important. ?Contact a health care provider if: ?You have blood in your stool 2-3 days after the procedure. ?Get help right away if you have: ?More than a small spotting of blood in your stool. ?Large blood clots in your stool. ?Swelling of your abdomen. ?Nausea or  vomiting. ?A fever. ?Increasing pain in your abdomen that is not relieved with medicine. ?Summary ?After the procedure, it is common to have a small amount of blood in your stool. You may also have mild cramping and bloating of your abdomen. ?If you were given a sedative during the procedure, it can affect you for several hours. Do not drive or operate machinery until your health care provider says that it is safe. ?Get help right away if you have a lot of blood in your stool, nausea or vomiting, a fever, or increased pain in your abdomen. ?This information is not intended to replace advice given to you by your health care provider. Make sure you discuss any questions you have with your health care provider. ?Document Revised: 01/05/2019 Document Reviewed: 09/25/2018 ?Elsevier Patient Education ? [place]. ?Monitored Anesthesia Care, Care After ?This sheet gives you information about how to care for yourself after your procedure. Your health care provider may also give you more specific instructions. If you have problems or questions, contact your health care provider. ?What can I expect after the procedure? ?After the procedure, it is common to have: ?Tiredness. ?Forgetfulness about what happened after the procedure. ?Impaired judgment for important decisions. ?Nausea or vomiting. ?Some difficulty with balance. ?Follow these instructions at home: ?For the time period you were told by your health care provider: ?  ?Rest as needed. ?Do not participate in activities where you could fall or become injured. ?Do not drive or use machinery. ?Do not drink alcohol. ?Do not take sleeping pills or medicines that cause drowsiness. ?Do not make important decisions or sign legal documents. ?Do not take care of children on your own. ?Eating and drinking ?Follow the diet that is recommended by your health care provider. ?Drink enough fluid to keep your urine pale yellow. ?If you vomit: ?Drink water, juice, or soup when  you can drink without vomiting. ?Make sure you have little or no nausea before eating solid foods. ?General instructions ?Have a responsible adult stay with you for the time you are told. It is important to have someone help care for you until you are awake and alert. ?Take over-the-counter and prescription medicines only as told by your health care provider. ?If you have sleep apnea, surgery and certain medicines can increase your risk for breathing problems. Follow instructions from your health care provider about wearing your sleep device: ?Anytime you are sleeping, including during daytime naps. ?While taking prescription pain medicines, sleeping medicines, or medicines that make you drowsy. ?Avoid smoking. ?Keep all follow-up visits as told by your health care provider. This is important. ?Contact a health care provider if: ?You keep feeling nauseous or you keep vomiting. ?You feel light-headed. ?You are still sleepy or having trouble with balance after 24 hours. ?You develop a rash. ?You have a fever. ?You have redness or swelling around the IV site. ?Get help right away  if: ?You have trouble breathing. ?You have new-onset confusion at home. ?Summary ?For several hours after your procedure, you may feel tired. You may also be forgetful and have poor judgment. ?Have a responsible adult stay with you for the time you are told. It is important to have someone help care for you until you are awake and alert. ?Rest as told. Do not drive or operate machinery. Do not drink alcohol or take sleeping pills. ?Get help right away if you have trouble breathing, or if you suddenly become confused. ?This information is not intended to replace advice given to you by your health care provider. Make sure you discuss any questions you have with your health care provider. ?Document Revised: 11/15/2019 Document Reviewed: 02/01/2019 ?Elsevier Patient Education ? [place]. ? ?

## 2021-05-25 ENCOUNTER — Other Ambulatory Visit: Payer: Self-pay

## 2021-05-25 ENCOUNTER — Encounter (HOSPITAL_COMMUNITY): Payer: Self-pay

## 2021-05-25 ENCOUNTER — Encounter (HOSPITAL_COMMUNITY)
Admission: RE | Admit: 2021-05-25 | Discharge: 2021-05-25 | Disposition: A | Discharge status: Home / Self Care | Payer: Medicare HMO | Source: Ambulatory Visit | Attending: Internal Medicine | Admitting: Internal Medicine

## 2021-05-25 DIAGNOSIS — Z01810 Encounter for preprocedural cardiovascular examination: Secondary | ICD-10-CM | POA: Insufficient documentation

## 2021-05-25 HISTORY — DX: Hypothyroidism, unspecified: E03.9

## 2021-05-27 ENCOUNTER — Ambulatory Visit (HOSPITAL_COMMUNITY)
Admission: RE | Admit: 2021-05-27 | Discharge: 2021-05-27 | Disposition: A | Discharge status: Home / Self Care | Payer: Medicare HMO | Attending: Internal Medicine | Admitting: Internal Medicine

## 2021-05-27 ENCOUNTER — Encounter (HOSPITAL_COMMUNITY)
Admission: RE | Disposition: A | Discharge status: Home / Self Care | Payer: Self-pay | Source: Home / Self Care | Attending: Internal Medicine

## 2021-05-27 ENCOUNTER — Ambulatory Visit (HOSPITAL_COMMUNITY): Payer: Medicare HMO | Admitting: Anesthesiology

## 2021-05-27 ENCOUNTER — Encounter (HOSPITAL_COMMUNITY): Payer: Self-pay | Admitting: Internal Medicine

## 2021-05-27 ENCOUNTER — Ambulatory Visit (HOSPITAL_BASED_OUTPATIENT_CLINIC_OR_DEPARTMENT_OTHER): Payer: Medicare HMO | Admitting: Anesthesiology

## 2021-05-27 DIAGNOSIS — K621 Rectal polyp: Secondary | ICD-10-CM

## 2021-05-27 DIAGNOSIS — Z801 Family history of malignant neoplasm of trachea, bronchus and lung: Secondary | ICD-10-CM | POA: Diagnosis not present

## 2021-05-27 DIAGNOSIS — K644 Residual hemorrhoidal skin tags: Secondary | ICD-10-CM | POA: Insufficient documentation

## 2021-05-27 DIAGNOSIS — E039 Hypothyroidism, unspecified: Secondary | ICD-10-CM | POA: Insufficient documentation

## 2021-05-27 DIAGNOSIS — K6389 Other specified diseases of intestine: Secondary | ICD-10-CM

## 2021-05-27 DIAGNOSIS — K219 Gastro-esophageal reflux disease without esophagitis: Secondary | ICD-10-CM | POA: Insufficient documentation

## 2021-05-27 DIAGNOSIS — Z87891 Personal history of nicotine dependence: Secondary | ICD-10-CM | POA: Insufficient documentation

## 2021-05-27 DIAGNOSIS — Z139 Encounter for screening, unspecified: Secondary | ICD-10-CM | POA: Diagnosis not present

## 2021-05-27 DIAGNOSIS — Z9221 Personal history of antineoplastic chemotherapy: Secondary | ICD-10-CM | POA: Insufficient documentation

## 2021-05-27 DIAGNOSIS — Z85118 Personal history of other malignant neoplasm of bronchus and lung: Secondary | ICD-10-CM | POA: Diagnosis not present

## 2021-05-27 DIAGNOSIS — I10 Essential (primary) hypertension: Secondary | ICD-10-CM | POA: Insufficient documentation

## 2021-05-27 DIAGNOSIS — Z1211 Encounter for screening for malignant neoplasm of colon: Secondary | ICD-10-CM | POA: Diagnosis not present

## 2021-05-27 DIAGNOSIS — Z8249 Family history of ischemic heart disease and other diseases of the circulatory system: Secondary | ICD-10-CM | POA: Insufficient documentation

## 2021-05-27 DIAGNOSIS — Z09 Encounter for follow-up examination after completed treatment for conditions other than malignant neoplasm: Secondary | ICD-10-CM | POA: Diagnosis not present

## 2021-05-27 DIAGNOSIS — K648 Other hemorrhoids: Secondary | ICD-10-CM | POA: Diagnosis not present

## 2021-05-27 DIAGNOSIS — Z86010 Personal history of colonic polyps: Secondary | ICD-10-CM | POA: Insufficient documentation

## 2021-05-27 DIAGNOSIS — D128 Benign neoplasm of rectum: Secondary | ICD-10-CM | POA: Diagnosis not present

## 2021-05-27 DIAGNOSIS — F419 Anxiety disorder, unspecified: Secondary | ICD-10-CM | POA: Diagnosis not present

## 2021-05-27 DIAGNOSIS — F32A Depression, unspecified: Secondary | ICD-10-CM | POA: Insufficient documentation

## 2021-05-27 DIAGNOSIS — D122 Benign neoplasm of ascending colon: Secondary | ICD-10-CM | POA: Insufficient documentation

## 2021-05-27 DIAGNOSIS — K635 Polyp of colon: Secondary | ICD-10-CM

## 2021-05-27 HISTORY — PX: BIOPSY: SHX5522

## 2021-05-27 HISTORY — PX: COLONOSCOPY WITH PROPOFOL: SHX5780

## 2021-05-27 HISTORY — PX: POLYPECTOMY: SHX5525

## 2021-05-27 LAB — HM COLONOSCOPY

## 2021-05-27 SURGERY — COLONOSCOPY WITH PROPOFOL
Anesthesia: General

## 2021-05-27 MED ORDER — LACTATED RINGERS IV SOLN: INTRAVENOUS | Status: DC | PRN

## 2021-05-27 MED ORDER — PROPOFOL 500 MG/50ML IV EMUL
INTRAVENOUS | Status: DC | PRN
  Administered 2021-05-27: 150 ug/kg/min via INTRAVENOUS

## 2021-05-27 MED ORDER — PROPOFOL 10 MG/ML IV BOLUS
INTRAVENOUS | Status: DC | PRN
Start: 2021-05-27 — End: 2021-05-27
  Administered 2021-05-27 (×2): 50 mg via INTRAVENOUS

## 2021-05-27 MED ORDER — LIDOCAINE HCL 1 % IJ SOLN
INTRAMUSCULAR | Status: DC | PRN
  Administered 2021-05-27: 50 mg via INTRADERMAL

## 2021-05-27 NOTE — Transfer of Care (Signed)
Immediate Anesthesia Transfer of Care Note ? ?Patient: Katie Wagner ? ?Procedure(s) Performed: COLONOSCOPY WITH PROPOFOL ?POLYPECTOMY ?BIOPSY ? ?Patient Location: Short Stay ? ?Anesthesia Type:General ? ?Level of Consciousness: awake ? ?Airway & Oxygen Therapy: Patient Spontanous Breathing ? ?Post-op Assessment: Report given to RN ? ?Post vital signs: Reviewed ? ?Last Vitals:  ?Vitals Value Taken Time  ?BP    ?Temp    ?Pulse    ?Resp    ?SpO2    ? ? ?Last Pain:  ?Vitals:  ? 05/27/21 1043  ?TempSrc:   ?PainSc: 0-No pain  ?   ? ?Patients Stated Pain Goal: 9 (05/27/21 0945) ? ?Complications: No notable events documented. ?

## 2021-05-27 NOTE — H&P (Signed)
Katie Wagner is an 81 y.o. female.   ?Chief Complaint: Patient is here for colonoscopy. ?HPI: Patient is 81 year old Caucasian female who has history of colonic polyps and is here for surveillance colonoscopy.  Her last exam was 11 years ago.  She remembers polyp was premalignant.  She denies abdominal pain change in bowel habits or rectal bleeding. ?Personal history significant for lung carcinoma for which she had surgery and chemotherapy last year. ?Patient does not take aspirin or NSAIDs. ? ?Past Medical History:  ?Diagnosis Date  ? Anxiety   ? Arthritis   ? Bursitis of left shoulder   ? Depression   ? Essential tremor   ? GERD (gastroesophageal reflux disease)   ? Heart disease   ? Hypercholesteremia   ? Hypertension   ? Hypothyroidism   ? Low back pain   ? ? ?Past Surgical History:  ?Procedure Laterality Date  ? APPENDECTOMY    ? THYROIDECTOMY    ? TONSILLECTOMY    ? TUBAL LIGATION    ? ? ?Family History  ?Problem Relation Age of Onset  ? Stroke Mother   ? Dementia Mother   ? Lung cancer Father   ? Heart disease Father   ? Thyroid disease Sister   ? Diabetes Brother   ? Alzheimer's disease Brother   ? ?Social History:  reports that she has quit smoking. She has never used smokeless tobacco. She reports current alcohol use. She reports that she does not use drugs. ? ?Allergies: No Known Allergies ? ?Medications Prior to Admission  ?Medication Sig Dispense Refill  ? acetaminophen (TYLENOL) 500 MG tablet Take 500 mg by mouth every 6 (six) hours as needed for moderate pain.    ? Calcium Carb-Cholecalciferol (CALCIUM 600/VITAMIN D3 PO) Take 1 tablet by mouth in the morning and at bedtime.    ? carboxymethylcellulose (REFRESH PLUS) 0.5 % SOLN Place 1 drop into both eyes 3 (three) times daily as needed (dry eyes).    ? fluticasone (FLONASE) 50 MCG/ACT nasal spray Place 1 spray into both nostrils daily.    ? levocetirizine (XYZAL) 5 MG tablet Take 5 mg by mouth daily.    ? levothyroxine (SYNTHROID) 75 MCG tablet  Take 75 mcg by mouth daily.    ? lisinopril-hydrochlorothiazide (PRINZIDE,ZESTORETIC) 20-12.5 MG tablet Take 1 tablet by mouth daily.    ? omeprazole (PRILOSEC) 20 MG capsule Take 20 mg by mouth daily.    ? pravastatin (PRAVACHOL) 20 MG tablet Take 20 mg by mouth daily.    ? propranolol (INDERAL) 10 MG tablet Take 10 mg by mouth 3 (three) times daily.    ? venlafaxine XR (EFFEXOR-XR) 150 MG 24 hr capsule Take 150 mg by mouth daily with breakfast.    ? ? ?No results found for this or any previous visit (from the past 48 hour(s)). ?No results found. ? ?Review of Systems ? ?Blood pressure (!) 139/58, temperature 98.7 ?F (37.1 ?C), temperature source Oral, resp. rate 18, SpO2 98 %. ?Physical Exam ?HENT:  ?   Mouth/Throat:  ?   Mouth: Mucous membranes are moist.  ?   Pharynx: Oropharynx is clear.  ?Eyes:  ?   General: No scleral icterus. ?   Conjunctiva/sclera: Conjunctivae normal.  ?Cardiovascular:  ?   Rate and Rhythm: Normal rate and regular rhythm.  ?   Heart sounds: Normal heart sounds. No murmur heard. ?Pulmonary:  ?   Effort: Pulmonary effort is normal.  ?   Breath sounds: Normal breath sounds.  ?Abdominal:  ?  General: There is no distension.  ?   Palpations: Abdomen is soft. There is no mass.  ?   Tenderness: There is no abdominal tenderness.  ?Musculoskeletal:     ?   General: No swelling.  ?   Cervical back: Neck supple.  ?Lymphadenopathy:  ?   Cervical: No cervical adenopathy.  ?Skin: ?   General: Skin is warm and dry.  ?Neurological:  ?   Mental Status: She is alert.  ?  ? ?Assessment/Plan ? ?History of colonic polyps ?Surveillance colonoscopy. ? ?Hildred Laser, MD ?05/27/2021, 10:39 AM ? ? ? ?

## 2021-05-27 NOTE — Op Note (Signed)
[facility] ?Patient Name: Katie Wagner ?Procedure Date: 05/27/2021 10:23 AM ?MRN: [id number] ?Date of Birth: 05-04-1940 ?Attending MD: Hildred Laser , MD ?CSN: [id number] ?Age: 81 ?Admit Type: Outpatient ?Procedure:                Colonoscopy ?Indications:              High risk colon cancer surveillance: Personal  ?                          history of colonic polyps ?Providers:                Hildred Laser, MD, Lambert Mody, Aram Candela ?Referring MD:             Isabel Caprice, PA ?Medicines:                Propofol per Anesthesia ?Complications:            No immediate complications. ?Estimated Blood Loss:     Estimated blood loss was minimal. ?Procedure:                Pre-Anesthesia Assessment: ?                          - Prior to the procedure, a History and Physical  ?                          was performed, and patient medications and  ?                          allergies were reviewed. The patient's tolerance of  ?                          previous anesthesia was also reviewed. The risks  ?                          and benefits of the procedure and the sedation  ?                          options and risks were discussed with the patient.  ?                          All questions were answered, and informed consent  ?                          was obtained. Prior Anticoagulants: The patient has  ?                          taken no previous anticoagulant or antiplatelet  ?                          agents. ASA Grade Assessment: II - A patient with  ?                          mild systemic disease. After reviewing the risks  ?  and benefits, the patient was deemed in  ?                          satisfactory condition to undergo the procedure. ?                          After obtaining informed consent, the colonoscope  ?                          was passed under direct vision. Throughout the  ?                          procedure, the patient's blood pressure, pulse, and  ?                           oxygen saturations were monitored continuously. The  ?                          PCF-HQ190L ([id number]) scope was introduced through  ?                          the anus and advanced to the the cecum, identified  ?                          by appendiceal orifice and ileocecal valve. The  ?                          colonoscopy was performed without difficulty. The  ?                          patient tolerated the procedure well. The quality  ?                          of the bowel preparation was good. The ileocecal  ?                          valve, appendiceal orifice, and rectum were  ?                          photographed. ?Scope In: 10:46:40 AM ?Scope Out: 11:05:44 AM ?Scope Withdrawal Time: 0 hours 13 minutes 17 seconds  ?Total Procedure Duration: 0 hours 19 minutes 4 seconds  ?Findings: ?     The perianal and digital rectal examinations were normal. ?     A 4 mm polyp was found in the ascending colon. The polyp was flat. The  ?     polyp was removed with a cold snare. Resection and retrieval were  ?     complete. The pathology specimen was placed into Bottle Number 1. ?     A diminutive polyp was found in the rectum. Biopsies were taken with a  ?     cold forceps for histology. The pathology specimen was placed into  ?     Bottle Number 1. ?     A diffuse area of mild melanosis was found in the recto-sigmoid colon,  ?  in the sigmoid colon, in the descending colon and at the splenic flexure. ?     External hemorrhoids were found during retroflexion. The hemorrhoids  ?     were small. ?Impression:               - One 4 mm polyp in the ascending colon, removed  ?                          with a cold snare. Resected and retrieved. ?                          - One diminutive polyp in the rectum. Biopsied. ?                          - Melanosis in the colon. ?                          - External hemorrhoids. ?Moderate Sedation: ?     Per Anesthesia Care ?Recommendation:           - Patient  has a contact number available for  ?                          emergencies. The signs and symptoms of potential  ?                          delayed complications were discussed with the  ?                          patient. Return to normal activities tomorrow.  ?                          Written discharge instructions were provided to the  ?                          patient. ?                          - High fiber diet today. ?                          - Continue present medications. ?                          - No aspirin, ibuprofen, naproxen, or other  ?                          non-steroidal anti-inflammatory drugs for 1 day. ?                          - Await pathology results. ?                          - Repeat colonoscopy is recommended. The  ?                          colonoscopy date will be determined  after pathology  ?                          results from today's exam become available for  ?                          review. ?Procedure Code(s):        --- Professional --- ?                          [phone], Colonoscopy, flexible; with removal of  ?                          tumor(s), polyp(s), or other lesion(s) by snare  ?                          technique ?                          45380, 59, Colonoscopy, flexible; with biopsy,  ?                          single or multiple ?Diagnosis Code(s):        --- Professional --- ?                          Z86.010, Personal history of colonic polyps ?                          K63.5, Polyp of colon ?                          K62.1, Rectal polyp ?                          K63.89, Other specified diseases of intestine ?                          K64.4, Residual hemorrhoidal skin tags ?CPT copyright 2019 American Medical Association. All rights reserved. ?The codes documented in this report are preliminary and upon coder review may  ?be revised to meet current compliance requirements. ?Hildred Laser, MD ?Hildred Laser, MD ?05/27/2021 11:13:58 AM ?This report has been  signed electronically. ?Number of Addenda: 0 ?

## 2021-05-27 NOTE — Anesthesia Postprocedure Evaluation (Signed)
Anesthesia Post Note ? ?Patient: Katie Wagner ? ?Procedure(s) Performed: COLONOSCOPY WITH PROPOFOL ?POLYPECTOMY ?BIOPSY ? ?Patient location during evaluation: Short Stay ?Anesthesia Type: General ?Level of consciousness: awake and alert ?Pain management: pain level controlled ?Vital Signs Assessment: post-procedure vital signs reviewed and stable ?Respiratory status: spontaneous breathing ?Cardiovascular status: blood pressure returned to baseline and stable ?Postop Assessment: no apparent nausea or vomiting ?Anesthetic complications: no ? ? ?No notable events documented. ? ? ?Last Vitals:  ?Vitals:  ? 05/27/21 0945 05/27/21 1110  ?BP: (!) 139/58 (!) 111/46  ?Pulse:  74  ?Resp: 18 20  ?Temp: 37.1 ?C 36.6 ?C  ?SpO2: 98% 100%  ?  ?Last Pain:  ?Vitals:  ? 05/27/21 1110  ?TempSrc: Oral  ?PainSc: 0-No pain  ? ? ?  ?  ?  ?  ?  ?  ? ?Tifanie Gardiner ? ? ? ? ?

## 2021-05-27 NOTE — Anesthesia Preprocedure Evaluation (Signed)
Anesthesia Evaluation  ?Patient identified by MRN, date of birth, ID band ?Patient awake ? ? ? ?Reviewed: ?Allergy & Precautions, H&P , NPO status , Patient's Chart, lab work & pertinent test results, reviewed documented beta blocker date and time  ? ?Airway ?Mallampati: II ? ?TM Distance: >3 FB ?Neck ROM: full ? ? ? Dental ?no notable dental hx. ? ?  ?Pulmonary ?neg pulmonary ROS, former smoker,  ?  ?Pulmonary exam normal ?breath sounds clear to auscultation ? ? ? ? ? ? Cardiovascular ?Exercise Tolerance: Good ?hypertension, negative cardio ROS ? ? ?Rhythm:regular Rate:Normal ? ? ?  ?Neuro/Psych ?PSYCHIATRIC DISORDERS Anxiety Depression negative neurological ROS ?   ? GI/Hepatic ?Neg liver ROS, GERD  Medicated,  ?Endo/Other  ?Hypothyroidism  ? Renal/GU ?negative Renal ROS  ?negative genitourinary ?  ?Musculoskeletal ? ? Abdominal ?  ?Peds ? Hematology ?negative hematology ROS ?(+)   ?Anesthesia Other Findings ? ? Reproductive/Obstetrics ?negative OB ROS ? ?  ? ? ? ? ? ? ? ? ? ? ? ? ? ?  ?  ? ? ? ? ? ? ? ? ?Anesthesia Physical ?Anesthesia Plan ? ?ASA: 3 ? ?Anesthesia Plan: General  ? ?Post-op Pain Management:   ? ?Induction:  ? ?PONV Risk Score and Plan: Propofol infusion ? ?Airway Management Planned:  ? ?Additional Equipment:  ? ?Intra-op Plan:  ? ?Post-operative Plan:  ? ?Informed Consent: I have reviewed the patients History and Physical, chart, labs and discussed the procedure including the risks, benefits and alternatives for the proposed anesthesia with the patient or authorized representative who has indicated his/her understanding and acceptance.  ? ? ? ?Dental Advisory Given ? ?Plan Discussed with: CRNA ? ?Anesthesia Plan Comments:   ? ? ? ? ? ? ?Anesthesia Quick Evaluation ? ?

## 2021-05-27 NOTE — Discharge Instructions (Signed)
No aspirin or NSAIDs for 24 hours ?Resume scheduled medications as before ?High-fiber diet ?No driving for 24 hours. ?Physician will call with biopsy results. ?

## 2021-05-28 ENCOUNTER — Encounter (INDEPENDENT_AMBULATORY_CARE_PROVIDER_SITE_OTHER): Payer: Self-pay | Admitting: *Deleted

## 2021-05-28 LAB — SURGICAL PATHOLOGY

## 2021-06-01 ENCOUNTER — Encounter (HOSPITAL_COMMUNITY): Payer: Self-pay | Admitting: Internal Medicine

## 2021-08-20 DIAGNOSIS — E119 Type 2 diabetes mellitus without complications: Secondary | ICD-10-CM | POA: Diagnosis not present

## 2021-08-20 DIAGNOSIS — Z0001 Encounter for general adult medical examination with abnormal findings: Secondary | ICD-10-CM | POA: Diagnosis not present

## 2021-08-20 DIAGNOSIS — I10 Essential (primary) hypertension: Secondary | ICD-10-CM | POA: Diagnosis not present

## 2021-08-20 DIAGNOSIS — Z6826 Body mass index (BMI) 26.0-26.9, adult: Secondary | ICD-10-CM | POA: Diagnosis not present

## 2021-08-20 DIAGNOSIS — E039 Hypothyroidism, unspecified: Secondary | ICD-10-CM | POA: Diagnosis not present

## 2021-08-20 DIAGNOSIS — R350 Frequency of micturition: Secondary | ICD-10-CM | POA: Diagnosis not present

## 2021-08-25 DIAGNOSIS — M8589 Other specified disorders of bone density and structure, multiple sites: Secondary | ICD-10-CM | POA: Diagnosis not present

## 2021-08-25 DIAGNOSIS — M810 Age-related osteoporosis without current pathological fracture: Secondary | ICD-10-CM | POA: Diagnosis not present

## 2021-09-29 IMAGING — CT NM PET TUM IMG INITIAL (PI) SKULL BASE T - THIGH
7 series · 25 of 25 positions shown · non-contrast
Comparison: None.

CLINICAL DATA: Initial treatment strategy for pulmonary nodule.

EXAM:
NUCLEAR MEDICINE PET SKULL BASE TO THIGH
TECHNIQUE: 8.6 mCi F-18 FDG was injected intravenously. Full-ring PET imaging
was performed from the skull base to thigh after the radiotracer. CT
data was obtained and used for attenuation correction and anatomic
localization.
Fasting blood glucose: 97 mg/dl

[Series 3: pet sk_thigh ac · axial · 5.0mm · 4.07mm/px · z∈[-964,-24]mm · 5 of 236 slices shown]
[im 1/236]
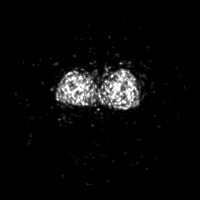
[im 59/236]
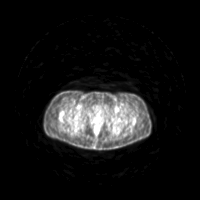
[im 118/236]
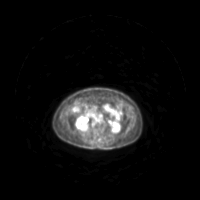
[im 177/236]
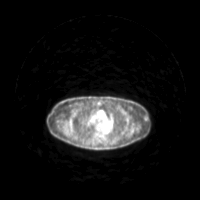
[im 236/236]
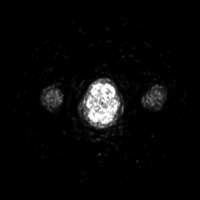

[Series 4: ct sk_thigh 5.0 b31f · axial · 5.0mm · 0.98mm/px · z∈[-964,-24]mm · 5 of 236 slices shown]
[im 1/236]
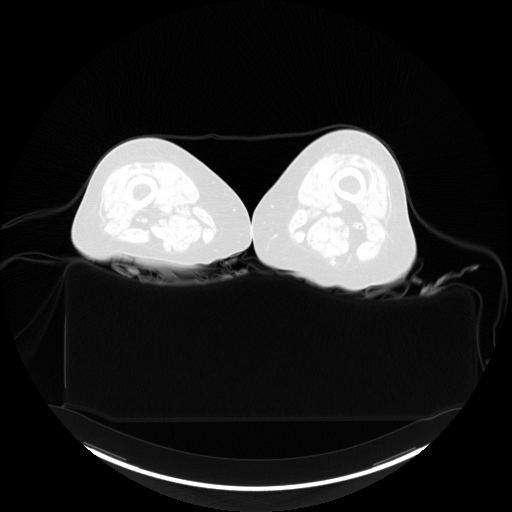
[im 59/236  brain]
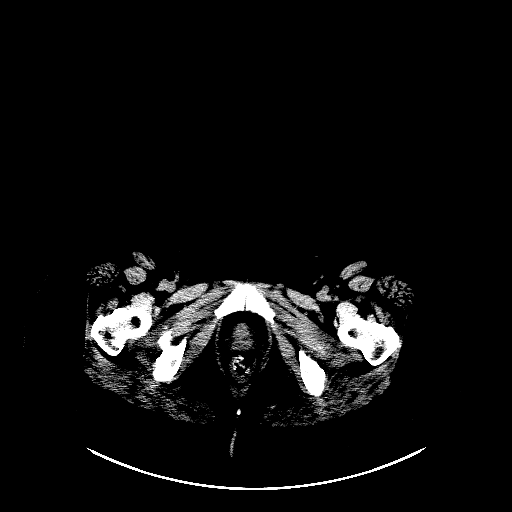
[im 118/236]
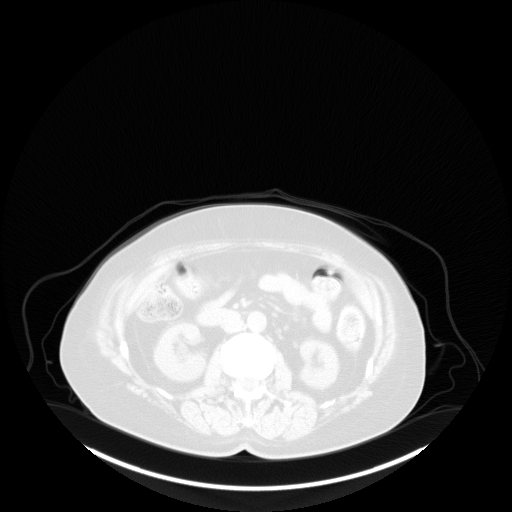
[im 177/236]
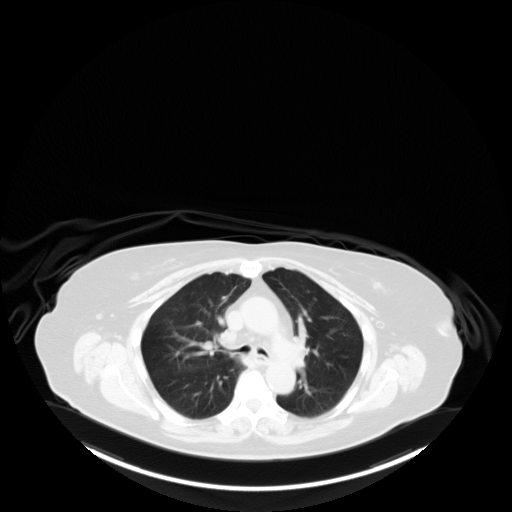
[im 236/236]
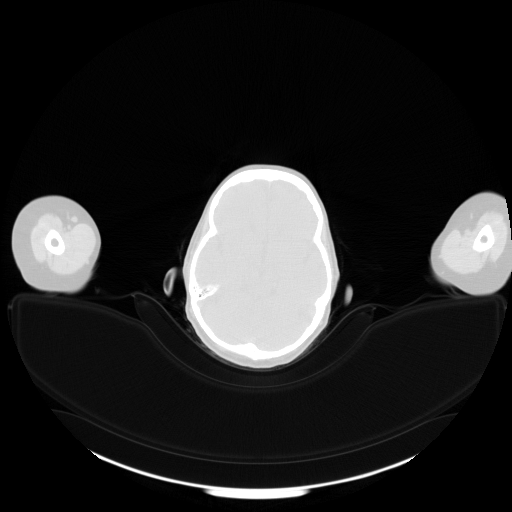

[Series 5: pet sk_thigh nac · axial · 5.0mm · 4.07mm/px · z∈[-964,-24]mm · 5 of 236 slices shown]
[im 1/236]
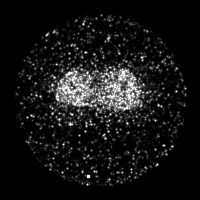
[im 59/236]
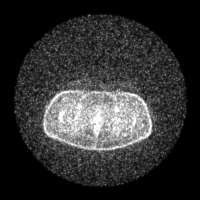
[im 118/236]
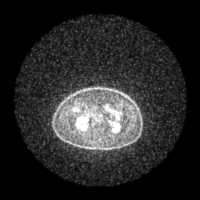
[im 177/236]
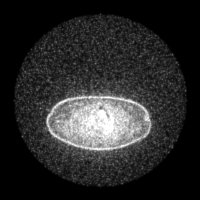
[im 236/236]
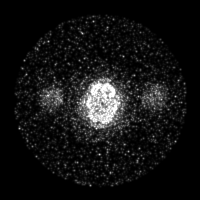

[Series 8: ct sk_thigh 5.0 b70f (id)_bone · axial · 5.0mm · 0.70mm/px · z∈[-470,-154]mm · 2 of 80 slices shown]
[im 1/80  bone]
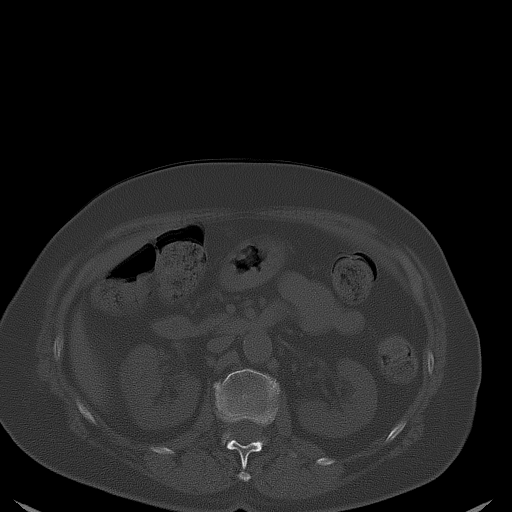
[im 80/80  bone]
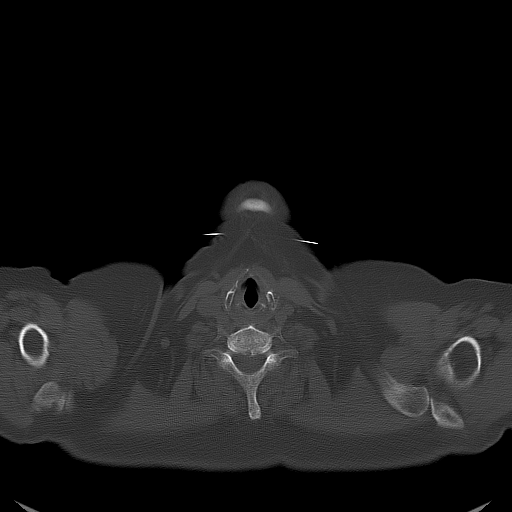

[Series 603: range-ct sk_thigh 5.0 (id)<alpha range> · 2 of 79 slices shown (1 of 2)]
[im 1/79]
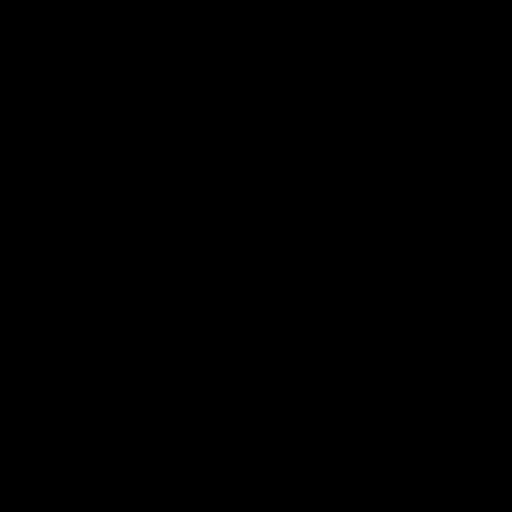
[im 79/79]
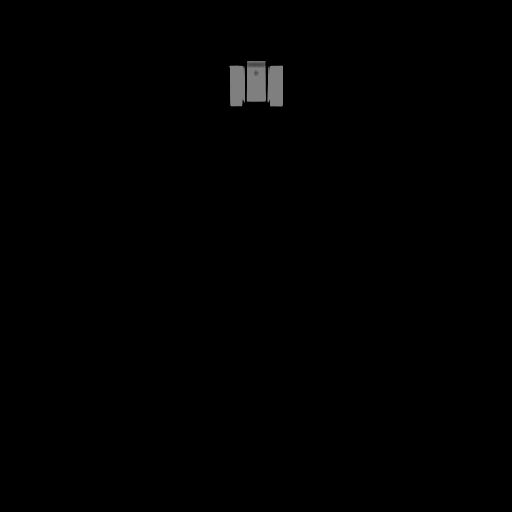

[Series 604: mip range 5 · coronal · 1.95mm/px · 1 of 32 slices shown]
[im 1/32]
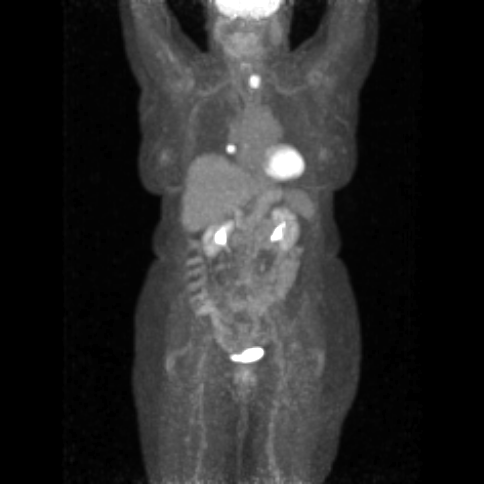

[Series 605: range-ct sk_thigh 5.0 (id)<alpha range> · 5 of 230 slices shown (2 of 2)]
[im 1/230]
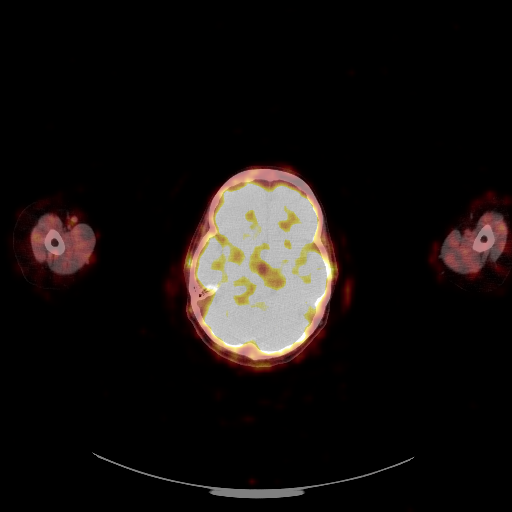
[im 58/230]
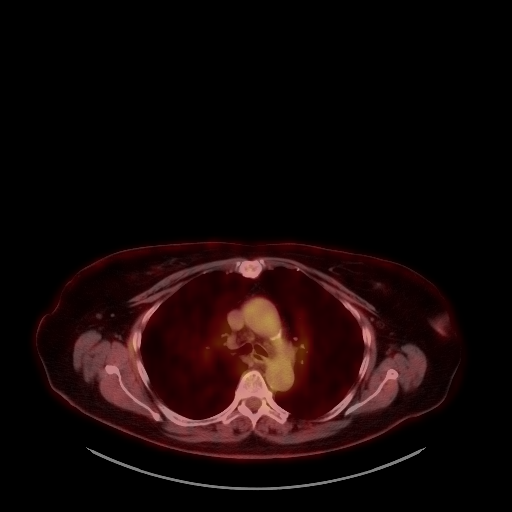
[im 115/230]
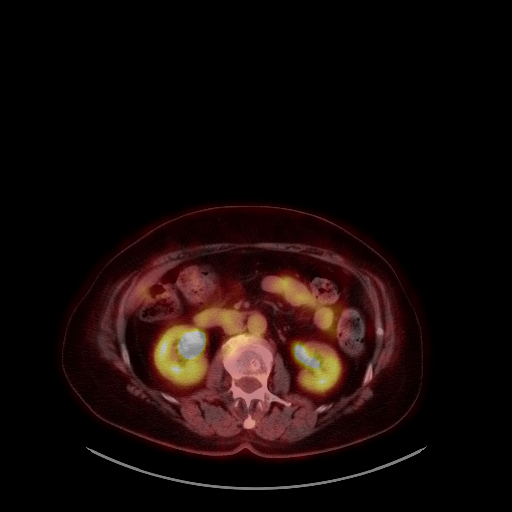
[im 172/230]
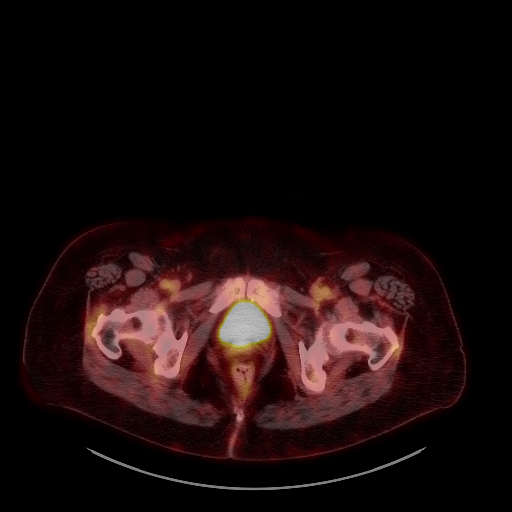
[im 230/230]
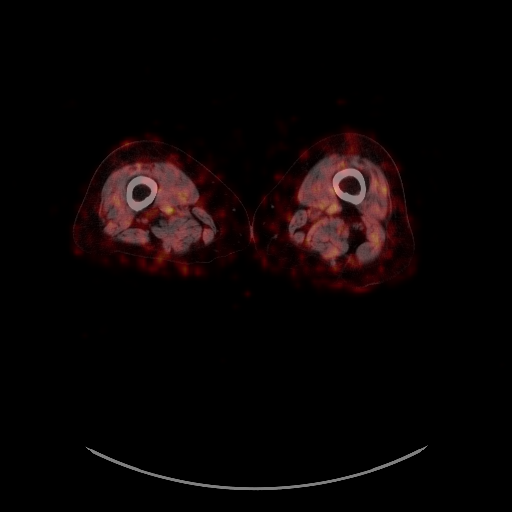

[25 of 25 positions shown; findings below may reference images not displayed]

FINDINGS: Mediastinal blood pool activity: SUV max

Liver activity: SUV max NA

NECK: Hypermetabolic nodule in the LEFT lobe of the thyroid gland
measures 2.3 cm (image 39/4) and has intense metabolic activity SUV
max equal 13.1.

Incidental CT findings:

CHEST: RIGHT upper lobe pulmonary nodule measures 17 mm (image 41/8)
and is intensely hypermetabolic with SUV max equal 13.9.

No additional hypermetabolic or enlarged pulmonary nodules. No
hypermetabolic mediastinal lymph nodes.

Incidental CT findings: none

ABDOMEN/PELVIS: No abnormal hypermetabolic activity within the
liver, pancreas, adrenal glands, or spleen. No hypermetabolic lymph
nodes in the abdomen or pelvis.

Incidental CT findings: none

SKELETON: No focal hypermetabolic activity to suggest skeletal
metastasis.

Incidental CT findings: none
IMPRESSION: 1. Intensely hypermetabolic RIGHT upper lobe pulmonary nodule
consistent with malignancy. Bronchogenic carcinoma would be most
likely diagnosis; however, lesion has very similar activity to the
LEFT thyroid nodule. Recommend tissue sampling.
2. Intensely hypermetabolic LEFT thyroid nodule. Differential
include to include primary thyroid carcinoma versus rare the thyroid
metastasis. Thyroid adenoma is less favored with high intensity.

## 2021-11-17 DIAGNOSIS — C342 Malignant neoplasm of middle lobe, bronchus or lung: Secondary | ICD-10-CM | POA: Diagnosis not present

## 2021-11-17 DIAGNOSIS — I700 Atherosclerosis of aorta: Secondary | ICD-10-CM | POA: Diagnosis not present

## 2021-11-17 DIAGNOSIS — Z902 Acquired absence of lung [part of]: Secondary | ICD-10-CM | POA: Diagnosis not present

## 2021-11-17 DIAGNOSIS — Z09 Encounter for follow-up examination after completed treatment for conditions other than malignant neoplasm: Secondary | ICD-10-CM | POA: Diagnosis not present

## 2021-11-17 DIAGNOSIS — R918 Other nonspecific abnormal finding of lung field: Secondary | ICD-10-CM | POA: Diagnosis not present

## 2021-11-17 DIAGNOSIS — I2510 Atherosclerotic heart disease of native coronary artery without angina pectoris: Secondary | ICD-10-CM | POA: Diagnosis not present

## 2021-11-20 DIAGNOSIS — C342 Malignant neoplasm of middle lobe, bronchus or lung: Secondary | ICD-10-CM | POA: Diagnosis not present

## 2021-11-20 DIAGNOSIS — E559 Vitamin D deficiency, unspecified: Secondary | ICD-10-CM | POA: Diagnosis not present

## 2022-01-18 DIAGNOSIS — I10 Essential (primary) hypertension: Secondary | ICD-10-CM | POA: Diagnosis not present

## 2022-01-18 DIAGNOSIS — Z6827 Body mass index (BMI) 27.0-27.9, adult: Secondary | ICD-10-CM | POA: Diagnosis not present

## 2022-01-18 DIAGNOSIS — Z13220 Encounter for screening for lipoid disorders: Secondary | ICD-10-CM | POA: Diagnosis not present

## 2022-01-18 DIAGNOSIS — E559 Vitamin D deficiency, unspecified: Secondary | ICD-10-CM | POA: Diagnosis not present

## 2022-01-18 DIAGNOSIS — Z23 Encounter for immunization: Secondary | ICD-10-CM | POA: Diagnosis not present

## 2022-01-18 DIAGNOSIS — Z0000 Encounter for general adult medical examination without abnormal findings: Secondary | ICD-10-CM | POA: Diagnosis not present

## 2022-02-11 DIAGNOSIS — Z23 Encounter for immunization: Secondary | ICD-10-CM | POA: Diagnosis not present

## 2022-02-22 DIAGNOSIS — L659 Nonscarring hair loss, unspecified: Secondary | ICD-10-CM | POA: Diagnosis not present

## 2022-02-22 DIAGNOSIS — Z1283 Encounter for screening for malignant neoplasm of skin: Secondary | ICD-10-CM | POA: Diagnosis not present

## 2022-02-22 DIAGNOSIS — D239 Other benign neoplasm of skin, unspecified: Secondary | ICD-10-CM | POA: Diagnosis not present

## 2022-02-22 DIAGNOSIS — L280 Lichen simplex chronicus: Secondary | ICD-10-CM | POA: Diagnosis not present

## 2022-05-13 DIAGNOSIS — Z1231 Encounter for screening mammogram for malignant neoplasm of breast: Secondary | ICD-10-CM | POA: Diagnosis not present

## 2022-05-18 DIAGNOSIS — E559 Vitamin D deficiency, unspecified: Secondary | ICD-10-CM | POA: Diagnosis not present

## 2022-05-18 DIAGNOSIS — C342 Malignant neoplasm of middle lobe, bronchus or lung: Secondary | ICD-10-CM | POA: Diagnosis not present

## 2022-05-25 DIAGNOSIS — E559 Vitamin D deficiency, unspecified: Secondary | ICD-10-CM | POA: Diagnosis not present

## 2022-05-25 DIAGNOSIS — C342 Malignant neoplasm of middle lobe, bronchus or lung: Secondary | ICD-10-CM | POA: Diagnosis not present

## 2022-07-19 DIAGNOSIS — R739 Hyperglycemia, unspecified: Secondary | ICD-10-CM | POA: Diagnosis not present

## 2022-07-19 DIAGNOSIS — E7849 Other hyperlipidemia: Secondary | ICD-10-CM | POA: Diagnosis not present

## 2022-07-19 DIAGNOSIS — K219 Gastro-esophageal reflux disease without esophagitis: Secondary | ICD-10-CM | POA: Diagnosis not present

## 2022-07-19 DIAGNOSIS — R5383 Other fatigue: Secondary | ICD-10-CM | POA: Diagnosis not present

## 2022-07-19 DIAGNOSIS — I10 Essential (primary) hypertension: Secondary | ICD-10-CM | POA: Diagnosis not present

## 2022-07-27 DIAGNOSIS — Z452 Encounter for adjustment and management of vascular access device: Secondary | ICD-10-CM | POA: Diagnosis not present

## 2022-07-28 DIAGNOSIS — E039 Hypothyroidism, unspecified: Secondary | ICD-10-CM | POA: Diagnosis not present

## 2022-07-28 DIAGNOSIS — E559 Vitamin D deficiency, unspecified: Secondary | ICD-10-CM | POA: Diagnosis not present

## 2022-07-28 DIAGNOSIS — Z6827 Body mass index (BMI) 27.0-27.9, adult: Secondary | ICD-10-CM | POA: Diagnosis not present

## 2022-07-28 DIAGNOSIS — I10 Essential (primary) hypertension: Secondary | ICD-10-CM | POA: Diagnosis not present

## 2022-07-28 DIAGNOSIS — F419 Anxiety disorder, unspecified: Secondary | ICD-10-CM | POA: Diagnosis not present

## 2022-08-26 DIAGNOSIS — Z961 Presence of intraocular lens: Secondary | ICD-10-CM | POA: Diagnosis not present

## 2022-08-26 DIAGNOSIS — H04123 Dry eye syndrome of bilateral lacrimal glands: Secondary | ICD-10-CM | POA: Diagnosis not present

## 2022-09-07 DIAGNOSIS — Z95828 Presence of other vascular implants and grafts: Secondary | ICD-10-CM | POA: Diagnosis not present

## 2022-10-19 DIAGNOSIS — Z452 Encounter for adjustment and management of vascular access device: Secondary | ICD-10-CM | POA: Diagnosis not present

## 2022-11-26 DIAGNOSIS — Z902 Acquired absence of lung [part of]: Secondary | ICD-10-CM | POA: Diagnosis not present

## 2022-11-26 DIAGNOSIS — J984 Other disorders of lung: Secondary | ICD-10-CM | POA: Diagnosis not present

## 2022-11-26 DIAGNOSIS — Z08 Encounter for follow-up examination after completed treatment for malignant neoplasm: Secondary | ICD-10-CM | POA: Diagnosis not present

## 2022-11-26 DIAGNOSIS — C3490 Malignant neoplasm of unspecified part of unspecified bronchus or lung: Secondary | ICD-10-CM | POA: Diagnosis not present

## 2022-11-26 DIAGNOSIS — Z85118 Personal history of other malignant neoplasm of bronchus and lung: Secondary | ICD-10-CM | POA: Diagnosis not present

## 2022-11-26 DIAGNOSIS — I700 Atherosclerosis of aorta: Secondary | ICD-10-CM | POA: Diagnosis not present

## 2022-11-26 DIAGNOSIS — R911 Solitary pulmonary nodule: Secondary | ICD-10-CM | POA: Diagnosis not present

## 2022-12-06 DIAGNOSIS — C342 Malignant neoplasm of middle lobe, bronchus or lung: Secondary | ICD-10-CM | POA: Diagnosis not present

## 2022-12-29 DIAGNOSIS — C342 Malignant neoplasm of middle lobe, bronchus or lung: Secondary | ICD-10-CM | POA: Diagnosis not present

## 2023-01-18 DIAGNOSIS — Z452 Encounter for adjustment and management of vascular access device: Secondary | ICD-10-CM | POA: Diagnosis not present

## 2023-01-19 DIAGNOSIS — E782 Mixed hyperlipidemia: Secondary | ICD-10-CM | POA: Diagnosis not present

## 2023-01-19 DIAGNOSIS — E7849 Other hyperlipidemia: Secondary | ICD-10-CM | POA: Diagnosis not present

## 2023-01-19 DIAGNOSIS — I10 Essential (primary) hypertension: Secondary | ICD-10-CM | POA: Diagnosis not present

## 2023-01-19 DIAGNOSIS — R5383 Other fatigue: Secondary | ICD-10-CM | POA: Diagnosis not present

## 2023-01-19 DIAGNOSIS — Z0000 Encounter for general adult medical examination without abnormal findings: Secondary | ICD-10-CM | POA: Diagnosis not present

## 2023-01-19 DIAGNOSIS — E559 Vitamin D deficiency, unspecified: Secondary | ICD-10-CM | POA: Diagnosis not present

## 2023-01-19 DIAGNOSIS — E039 Hypothyroidism, unspecified: Secondary | ICD-10-CM | POA: Diagnosis not present

## 2023-01-24 DIAGNOSIS — Z6827 Body mass index (BMI) 27.0-27.9, adult: Secondary | ICD-10-CM | POA: Diagnosis not present

## 2023-01-24 DIAGNOSIS — Z1331 Encounter for screening for depression: Secondary | ICD-10-CM | POA: Diagnosis not present

## 2023-01-24 DIAGNOSIS — I10 Essential (primary) hypertension: Secondary | ICD-10-CM | POA: Diagnosis not present

## 2023-01-24 DIAGNOSIS — Z1389 Encounter for screening for other disorder: Secondary | ICD-10-CM | POA: Diagnosis not present

## 2023-01-24 DIAGNOSIS — Z23 Encounter for immunization: Secondary | ICD-10-CM | POA: Diagnosis not present

## 2023-01-24 DIAGNOSIS — Z0000 Encounter for general adult medical examination without abnormal findings: Secondary | ICD-10-CM | POA: Diagnosis not present

## 2023-01-24 DIAGNOSIS — C3491 Malignant neoplasm of unspecified part of right bronchus or lung: Secondary | ICD-10-CM | POA: Diagnosis not present

## 2023-02-01 DIAGNOSIS — Z23 Encounter for immunization: Secondary | ICD-10-CM | POA: Diagnosis not present

## 2023-02-21 DIAGNOSIS — D1801 Hemangioma of skin and subcutaneous tissue: Secondary | ICD-10-CM | POA: Diagnosis not present

## 2023-02-21 DIAGNOSIS — L821 Other seborrheic keratosis: Secondary | ICD-10-CM | POA: Diagnosis not present

## 2023-02-21 DIAGNOSIS — L723 Sebaceous cyst: Secondary | ICD-10-CM | POA: Diagnosis not present

## 2023-03-11 DIAGNOSIS — R918 Other nonspecific abnormal finding of lung field: Secondary | ICD-10-CM | POA: Diagnosis not present

## 2023-03-11 DIAGNOSIS — C342 Malignant neoplasm of middle lobe, bronchus or lung: Secondary | ICD-10-CM | POA: Diagnosis not present

## 2023-03-11 DIAGNOSIS — J984 Other disorders of lung: Secondary | ICD-10-CM | POA: Diagnosis not present

## 2023-03-11 DIAGNOSIS — I700 Atherosclerosis of aorta: Secondary | ICD-10-CM | POA: Diagnosis not present

## 2023-03-29 DIAGNOSIS — Z902 Acquired absence of lung [part of]: Secondary | ICD-10-CM | POA: Diagnosis not present

## 2023-03-29 DIAGNOSIS — C342 Malignant neoplasm of middle lobe, bronchus or lung: Secondary | ICD-10-CM | POA: Diagnosis not present

## 2023-03-29 DIAGNOSIS — Z95828 Presence of other vascular implants and grafts: Secondary | ICD-10-CM | POA: Diagnosis not present

## 2023-03-29 DIAGNOSIS — R059 Cough, unspecified: Secondary | ICD-10-CM | POA: Diagnosis not present

## 2023-04-11 DIAGNOSIS — J069 Acute upper respiratory infection, unspecified: Secondary | ICD-10-CM | POA: Diagnosis not present

## 2023-04-11 DIAGNOSIS — Z6827 Body mass index (BMI) 27.0-27.9, adult: Secondary | ICD-10-CM | POA: Diagnosis not present

## 2023-04-11 DIAGNOSIS — C3491 Malignant neoplasm of unspecified part of right bronchus or lung: Secondary | ICD-10-CM | POA: Diagnosis not present

## 2023-05-10 DIAGNOSIS — Z452 Encounter for adjustment and management of vascular access device: Secondary | ICD-10-CM | POA: Diagnosis not present

## 2023-05-10 DIAGNOSIS — Z95828 Presence of other vascular implants and grafts: Secondary | ICD-10-CM | POA: Diagnosis not present

## 2023-06-21 DIAGNOSIS — C342 Malignant neoplasm of middle lobe, bronchus or lung: Secondary | ICD-10-CM | POA: Diagnosis not present

## 2023-06-21 DIAGNOSIS — Z452 Encounter for adjustment and management of vascular access device: Secondary | ICD-10-CM | POA: Diagnosis not present

## 2023-06-22 DIAGNOSIS — Z1231 Encounter for screening mammogram for malignant neoplasm of breast: Secondary | ICD-10-CM | POA: Diagnosis not present

## 2023-06-28 DIAGNOSIS — C342 Malignant neoplasm of middle lobe, bronchus or lung: Secondary | ICD-10-CM | POA: Diagnosis not present

## 2023-07-20 DIAGNOSIS — R739 Hyperglycemia, unspecified: Secondary | ICD-10-CM | POA: Diagnosis not present

## 2023-07-26 DIAGNOSIS — F419 Anxiety disorder, unspecified: Secondary | ICD-10-CM | POA: Diagnosis not present

## 2023-07-26 DIAGNOSIS — Z6827 Body mass index (BMI) 27.0-27.9, adult: Secondary | ICD-10-CM | POA: Diagnosis not present

## 2023-07-26 DIAGNOSIS — G250 Essential tremor: Secondary | ICD-10-CM | POA: Diagnosis not present

## 2023-07-26 DIAGNOSIS — I10 Essential (primary) hypertension: Secondary | ICD-10-CM | POA: Diagnosis not present

## 2023-07-26 DIAGNOSIS — E039 Hypothyroidism, unspecified: Secondary | ICD-10-CM | POA: Diagnosis not present

## 2023-08-03 DIAGNOSIS — Z95828 Presence of other vascular implants and grafts: Secondary | ICD-10-CM | POA: Diagnosis not present

## 2023-09-27 DIAGNOSIS — C342 Malignant neoplasm of middle lobe, bronchus or lung: Secondary | ICD-10-CM | POA: Diagnosis not present

## 2023-09-27 DIAGNOSIS — Z902 Acquired absence of lung [part of]: Secondary | ICD-10-CM | POA: Diagnosis not present

## 2023-10-19 DIAGNOSIS — W57XXXA Bitten or stung by nonvenomous insect and other nonvenomous arthropods, initial encounter: Secondary | ICD-10-CM | POA: Diagnosis not present

## 2023-10-19 DIAGNOSIS — L280 Lichen simplex chronicus: Secondary | ICD-10-CM | POA: Diagnosis not present

## 2023-11-16 DIAGNOSIS — C342 Malignant neoplasm of middle lobe, bronchus or lung: Secondary | ICD-10-CM | POA: Diagnosis not present

## 2023-11-16 DIAGNOSIS — Z95828 Presence of other vascular implants and grafts: Secondary | ICD-10-CM | POA: Diagnosis not present

## 2023-11-16 DIAGNOSIS — Z452 Encounter for adjustment and management of vascular access device: Secondary | ICD-10-CM | POA: Diagnosis not present

## 2024-01-11 DIAGNOSIS — Z95828 Presence of other vascular implants and grafts: Secondary | ICD-10-CM | POA: Diagnosis not present

## 2024-02-01 DIAGNOSIS — E039 Hypothyroidism, unspecified: Secondary | ICD-10-CM | POA: Diagnosis not present

## 2024-02-01 DIAGNOSIS — E7849 Other hyperlipidemia: Secondary | ICD-10-CM | POA: Diagnosis not present

## 2024-02-01 DIAGNOSIS — I10 Essential (primary) hypertension: Secondary | ICD-10-CM | POA: Diagnosis not present

## 2024-02-14 DIAGNOSIS — Z23 Encounter for immunization: Secondary | ICD-10-CM | POA: Diagnosis not present

## 2024-02-14 DIAGNOSIS — Z2911 Encounter for prophylactic immunotherapy for respiratory syncytial virus (RSV): Secondary | ICD-10-CM | POA: Diagnosis not present

## 2024-02-21 DIAGNOSIS — D1801 Hemangioma of skin and subcutaneous tissue: Secondary | ICD-10-CM | POA: Diagnosis not present

## 2024-02-21 DIAGNOSIS — L821 Other seborrheic keratosis: Secondary | ICD-10-CM | POA: Diagnosis not present

## 2024-02-21 DIAGNOSIS — L280 Lichen simplex chronicus: Secondary | ICD-10-CM | POA: Diagnosis not present

## 2024-02-22 DIAGNOSIS — Z95828 Presence of other vascular implants and grafts: Secondary | ICD-10-CM | POA: Diagnosis not present

## 2024-03-19 DIAGNOSIS — C342 Malignant neoplasm of middle lobe, bronchus or lung: Secondary | ICD-10-CM

## 2024-03-19 DIAGNOSIS — R911 Solitary pulmonary nodule: Secondary | ICD-10-CM

## 2024-04-05 ENCOUNTER — Encounter (HOSPITAL_COMMUNITY)
Admission: RE | Admit: 2024-04-05 | Discharge: 2024-04-05 | Disposition: A | Discharge status: Home / Self Care | Source: Ambulatory Visit | Attending: Medical Oncology | Admitting: Medical Oncology

## 2024-04-05 DIAGNOSIS — R911 Solitary pulmonary nodule: Secondary | ICD-10-CM | POA: Diagnosis present

## 2024-04-05 DIAGNOSIS — C342 Malignant neoplasm of middle lobe, bronchus or lung: Secondary | ICD-10-CM | POA: Insufficient documentation

## 2024-04-05 MED ORDER — FLUDEOXYGLUCOSE F - 18 (FDG) INJECTION
7.3100 | Freq: Once | INTRAVENOUS | Status: AC | PRN
  Administered 2024-04-05: 7.31 via INTRAVENOUS
